# Patient Record
Sex: Male | Born: 1990
Health system: Southern US, Community
[De-identification: ages and names within clinical notes are randomized; demographics above are authoritative.]

## PROBLEM LIST (undated history)

## (undated) DIAGNOSIS — K219 Gastro-esophageal reflux disease without esophagitis: Secondary | ICD-10-CM

## (undated) DIAGNOSIS — F419 Anxiety disorder, unspecified: Secondary | ICD-10-CM

## (undated) DIAGNOSIS — F329 Major depressive disorder, single episode, unspecified: Secondary | ICD-10-CM

## (undated) DIAGNOSIS — T7840XA Allergy, unspecified, initial encounter: Secondary | ICD-10-CM

## (undated) DIAGNOSIS — I10 Essential (primary) hypertension: Secondary | ICD-10-CM

## (undated) HISTORY — DX: Allergy, unspecified, initial encounter: T78.40XA

## (undated) HISTORY — DX: Anxiety disorder, unspecified: F41.9

## (undated) HISTORY — DX: Major depressive disorder, single episode, unspecified: F32.9

## (undated) HISTORY — DX: Gastro-esophageal reflux disease without esophagitis: K21.9

---
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
## 2008-01-04 ENCOUNTER — Ambulatory Visit: Payer: Self-pay | Admitting: Vascular Surgery

## 2011-03-15 NOTE — Procedures (Signed)
RENAL ARTERY DUPLEX EVALUATION   INDICATION:  Uncontrolled hypertension.   HISTORY:  Diabetes:  No.  Cardiac:  No.  Hypertension:  Yes.  Smoking:  No.   RENAL ARTERY DUPLEX FINDINGS:  Aorta-Proximal Right:  161 cm/s  Aorta-Mid:  163 cm/s  Aorta-Distal:  147 cm/s  Celiac Artery Origin:  156 cm/s  SMA Origin:  178 cm/s                                    RIGHT               LEFT  Renal Artery Origin:             158 cm/s            144 cm/s  Renal Artery Proximal:           169 cm/s            165 cm/s  Renal Artery Mid:                93 cm/s             107 cm/s  Renal Artery Distal:             85 cm/s             100 cm/s  Hilar Acceleration Time (AT):    m/s2                m/s2  Renal-Aortic Ratio (RAR):        1                   1  Kidney Size:                     11.00 cm            11.58 cm  End Diastolic Ratio (EDR):  Resistive Index (RI):            0.64                0.68   IMPRESSION:  1. No evidence of stenosis noted in bilateral renal arteries based on      aorta renal ratio and velocity criteria.  2. Bilateral kidneys measured within normal limit, right measured      11.00 cm, left measured 11.58 cm.  3. Bilateral resistive indexes are within normal limits.   ___________________________________________  Quita Skye Hart [place], M.D.   MG/MEDQ  D:  01/04/2008  T:  01/04/2008  Job:  [id number]

## 2011-12-12 ENCOUNTER — Encounter (HOSPITAL_COMMUNITY): Payer: Self-pay | Admitting: Emergency Medicine

## 2011-12-12 ENCOUNTER — Emergency Department (HOSPITAL_COMMUNITY)
Admission: EM | Admit: 2011-12-12 | Discharge: 2011-12-12 | Disposition: A | Discharge status: Home / Self Care | Payer: BC Managed Care – PPO | Source: Home / Self Care

## 2011-12-12 DIAGNOSIS — J019 Acute sinusitis, unspecified: Secondary | ICD-10-CM

## 2011-12-12 HISTORY — DX: Essential (primary) hypertension: I10

## 2011-12-12 MED ORDER — DOXYCYCLINE HYCLATE 100 MG PO CAPS: 100.0000 mg | ORAL_CAPSULE | Freq: Two times a day (BID) | ORAL | Status: AC

## 2011-12-12 MED ORDER — PSEUDOEPHEDRINE-GUAIFENESIN ER 120-1200 MG PO TB12: 120.0000 mg | ORAL_TABLET | Freq: Two times a day (BID) | ORAL | Status: DC

## 2011-12-12 NOTE — ED Provider Notes (Signed)
History     CSN: [id number]  Arrival date & time 12/12/11  1817   None     Chief Complaint  Patient presents with  . Sore Throat    (Consider location/radiation/quality/duration/timing/severity/associated sxs/prior treatment) Patient is a 21 y.o. male presenting with pharyngitis. The history is provided by the patient.  Sore Throat This is a new problem. The current episode started more than 1 week ago (2 week hx). The problem has not changed since onset.Pertinent negatives include no chest pain, no abdominal pain and no shortness of breath. The symptoms are aggravated by coughing.    Past Medical History  Diagnosis Date  . Hypertension     History reviewed. No pertinent past surgical history.  No family history on file.  History  Substance Use Topics  . Smoking status: Not on file  . Smokeless tobacco: Not on file  . Alcohol Use: No      Review of Systems  Constitutional: Negative.   HENT: Positive for congestion, sore throat, rhinorrhea and postnasal drip.   Respiratory: Positive for cough. Negative for shortness of breath and wheezing.   Cardiovascular: Negative for chest pain.  Gastrointestinal: Negative for abdominal pain.  Skin: Negative.     Allergies  Review of patient's allergies indicates no known allergies.  Home Medications   Current Outpatient Rx  Name Route Sig Dispense Refill  . LABETALOL HCL 100 MG PO TABS Oral Take 100 mg by mouth 2 (two) times daily.    Marland Kitchen LISINOPRIL 10 MG PO TABS Oral Take 10 mg by mouth daily.    Marland Kitchen DOXYCYCLINE HYCLATE 100 MG PO CAPS Oral Take 1 capsule (100 mg total) by mouth 2 (two) times daily. 20 capsule 0  . PSEUDOEPHEDRINE-GUAIFENESIN ER [phone] MG PO TB12 Oral Take 120-1,200 mg by mouth 2 (two) times daily. 30 each 0    There were no vitals taken for this visit.  Physical Exam  Nursing note and vitals reviewed. Constitutional: He appears well-developed and well-nourished.  HENT:  Head: Normocephalic.  Right  Ear: External ear normal.  Left Ear: External ear normal.  Nose: Nose normal.  Mouth/Throat: Oropharynx is clear and moist.  Eyes: Conjunctivae are normal. Pupils are equal, round, and reactive to light.  Neck: Normal range of motion. Neck supple.  Cardiovascular: Normal rate, normal heart sounds and intact distal pulses.   Pulmonary/Chest: Effort normal and breath sounds normal.  Lymphadenopathy:    He has no cervical adenopathy.  Skin: Skin is warm and dry.    ED Course  Procedures (including critical care time)  Labs Reviewed - No data to display No results found.   1. Sinusitis acute       MDM          [name], MD 12/16/11 1312

## 2011-12-12 NOTE — ED Notes (Signed)
Pt. Stated, sore throat, chest congestion, cough, and nose stopped up for about 2 weeks.

## 2016-08-02 DIAGNOSIS — Z23 Encounter for immunization: Secondary | ICD-10-CM | POA: Diagnosis not present

## 2016-10-14 DIAGNOSIS — L74519 Primary focal hyperhidrosis, unspecified: Secondary | ICD-10-CM | POA: Diagnosis not present

## 2016-10-14 DIAGNOSIS — G250 Essential tremor: Secondary | ICD-10-CM | POA: Diagnosis not present

## 2016-10-14 DIAGNOSIS — I10 Essential (primary) hypertension: Secondary | ICD-10-CM | POA: Diagnosis not present

## 2016-11-25 DIAGNOSIS — J029 Acute pharyngitis, unspecified: Secondary | ICD-10-CM | POA: Diagnosis not present

## 2016-11-25 DIAGNOSIS — J309 Allergic rhinitis, unspecified: Secondary | ICD-10-CM | POA: Diagnosis not present

## 2017-05-12 ENCOUNTER — Emergency Department (HOSPITAL_BASED_OUTPATIENT_CLINIC_OR_DEPARTMENT_OTHER)
Admission: EM | Admit: 2017-05-12 | Discharge: 2017-05-12 | Disposition: A | Discharge status: Home / Self Care | Payer: BLUE CROSS/BLUE SHIELD | Attending: Emergency Medicine | Admitting: Emergency Medicine

## 2017-05-12 ENCOUNTER — Encounter (HOSPITAL_BASED_OUTPATIENT_CLINIC_OR_DEPARTMENT_OTHER): Payer: Self-pay | Admitting: Emergency Medicine

## 2017-05-12 DIAGNOSIS — I10 Essential (primary) hypertension: Secondary | ICD-10-CM | POA: Insufficient documentation

## 2017-05-12 DIAGNOSIS — Z79899 Other long term (current) drug therapy: Secondary | ICD-10-CM | POA: Diagnosis not present

## 2017-05-12 DIAGNOSIS — R6884 Jaw pain: Secondary | ICD-10-CM | POA: Diagnosis not present

## 2017-05-12 MED ORDER — HYDROCODONE-ACETAMINOPHEN 5-325 MG PO TABS
1.0000 | ORAL_TABLET | Freq: Once | ORAL | Status: AC
  Administered 2017-05-12: 1 via ORAL
  Filled 2017-05-12: qty 1

## 2017-05-12 NOTE — ED Triage Notes (Signed)
Patient states that he developed right sided jaw pain acutely about 30 minutes ago

## 2017-05-12 NOTE — Discharge Instructions (Signed)
Alternate 600 mg of ibuprofen and [phone] mg of Tylenol every 3 hours for the next 3 or 4 days. Do not exceed 4000 mg of Tylenol daily. Apply ice or heat to the affected area for comfort. Follow-up with your primary care physician or dentist for reevaluation. Return to the ED immediately if any concerning signs or symptoms develop such as fever, chills, facial swelling, difficulty swallowing, drooling, or rash.

## 2017-05-12 NOTE — ED Notes (Signed)
Pt. Reports starting to eat lunch today and feeling like someone had hit him in the R jaw.  Pt. Has no noted injury and no look of dislocation to the R jaw.  Pt. Reports he feels like he cant open his mouth on the R side with out pain in the R jaw at the R ear.  Pt. Explains that he has pain into his lower jaw area.Marland Kitchen

## 2017-05-12 NOTE — ED Provider Notes (Signed)
[facility] Provider Note   CSN: [id number] Arrival date & time: 05/12/17  1402     History   Chief Complaint Chief Complaint  Patient presents with  . Jaw Pain    HPI Joseph Mccullough is a 26 y.o. male who presents today with chief complaint acute onset, constant right-sided jaw pain which began 30 minutes ago when he was sitting down to eat. Pain is sharp and stabbing in nature. Worse with swallowing. Patient able to eat and drink without difficulty. He has taken ibuprofen without relief of his symptoms. Patient denies trauma or injury. Denies fevers, chills, facial swelling, sore throat, nasal congestion, ear pain, shortness of breath, chest pain, drooling.  The history is provided by the patient.    Past Medical History:  Diagnosis Date  . Hypertension     There are no active problems to display for this patient.   History reviewed. No pertinent surgical history.     Home Medications    Prior to Admission medications   Medication Sig Start Date End Date Taking? Authorizing Provider  propantheline (PROBANTHINE) 15 MG tablet Take 15 mg by mouth 3 (three) times daily with meals.   Yes [provider]  propranolol (INDERAL) 10 MG tablet Take 10 mg by mouth 3 (three) times daily.   Yes [provider]  labetalol (NORMODYNE) 100 MG tablet Take 100 mg by mouth 2 (two) times daily.    [provider]  lisinopril (PRINIVIL,ZESTRIL) 10 MG tablet Take 10 mg by mouth daily.    [provider]  Pseudoephedrine-Guaifenesin [phone] MG TB12 Take 120-1,200 mg by mouth 2 (two) times daily. 12/12/11   Billy Fischer, MD    Family History History reviewed. No pertinent family history.  Social History Social History  Substance Use Topics  . Smoking status: Never Smoker  . Smokeless tobacco: Never Used  . Alcohol use No     Allergies   Patient has no known allergies.   Review of Systems Review of Systems  Constitutional:  Negative for chills and fever.  HENT: Negative for dental problem, drooling, ear discharge, ear pain, facial swelling, sore throat and trouble swallowing.        Right jaw pain  Respiratory: Negative for shortness of breath.   Cardiovascular: Negative for chest pain.     Physical Exam Updated Vital Signs BP (!) 141/86 (BP Location: Left Arm)   Pulse (!) 54   Temp 97.8 F (36.6 C) (Oral)   Resp 18   Ht 6\' 2"  (1.88 m)   Wt 81.6 kg (180 lb)   SpO2 100%   BMI 23.11 kg/m   Physical Exam  Constitutional: He appears well-developed and well-nourished. No distress.  HENT:  Head: Normocephalic and atraumatic.  Right Ear: External ear normal.  Left Ear: External ear normal.  Mouth/Throat: Oropharynx is clear and moist.  TMs normal bilaterally, no frontal or maxillary sinus TTP. nasal septum is midline with pink mucosa. Posterior oropharynx is without tonsillar hypertrophy, erythema, exudates, or uvular deviation. No trismus or sublingual abnormalities. Dentition is normal in gingiva appear pink and healthy throughout. No tenderness elicited on palpation of the maxilla or mandible. No swelling of the face. No deformity or crepitus noted.  Eyes: Pupils are equal, round, and reactive to light. Conjunctivae are normal. Right eye exhibits no discharge. Left eye exhibits no discharge.  Neck: Normal range of motion. Neck supple. No JVD present. No tracheal deviation present. No thyromegaly present.  Cardiovascular: Normal rate.  Pulmonary/Chest: Effort normal.  Abdominal: He exhibits no distension.  Musculoskeletal: Normal range of motion. He exhibits no edema or tenderness.  Lymphadenopathy:    He has no cervical adenopathy.  Neurological: He is alert. No cranial nerve deficit or sensory deficit.  Fluent speech, no facial droop, cranial nerves III through XII tested and intact. No pronator drift. Sensation intact to soft touch of extremities.  Skin: Skin is dry. Capillary refill takes less  than 2 seconds. No erythema.  Psychiatric: He has a normal mood and affect. His behavior is normal.  Nursing note and vitals reviewed.    ED Treatments / Results  Labs (all labs ordered are listed, but only abnormal results are displayed) Labs Reviewed - No data to display  EKG  EKG Interpretation None       Radiology No results found.  Procedures Procedures (including critical care time)  Medications Ordered in ED Medications  HYDROcodone-acetaminophen (NORCO/VICODIN) 5-325 MG per tablet 1 tablet (1 tablet Oral Given 05/12/17 1447)     Initial Impression / Assessment and Plan / ED Course  I have reviewed the triage vital signs and the nursing notes.  Pertinent labs & imaging results that were available during my care of the patient were reviewed by me and considered in my medical decision making (see chart for details).     Patient with acute onset of right-sided jaw pain 30 minutes PTA. Afebrile, vital signs are stable, in no apparent distress. Dentition is normal, no suspicion of dental abscess, PTA, or soft tissue infection. Low suspicion of trigeminal neuralgia. Pain is not reproducible on palpation. Airway is patent, and patient is able to tolerate by mouth fluids. Suspect possible TMJ versus VZV (however no rash). Low suspicion of CVA No further emergent workup required at this time. He will follow-up with his dentist and primary care physician for reevaluation. Pain managed while in the ED. Discussed indications for return to the ED. Pt verbalized understanding of and agreement with plan and is safe for discharge home at this time.   Final Clinical Impressions(s) / ED Diagnoses   Final diagnoses:  Jaw pain    New Prescriptions Discharge Medication List as of 05/12/2017  2:36 PM       Renita Papa, PA-C 05/12/17 1548    Fredia Sorrow, MD 05/13/17 1108

## 2017-06-30 DIAGNOSIS — J329 Chronic sinusitis, unspecified: Secondary | ICD-10-CM | POA: Diagnosis not present

## 2017-10-13 DIAGNOSIS — I10 Essential (primary) hypertension: Secondary | ICD-10-CM | POA: Diagnosis not present

## 2017-10-13 DIAGNOSIS — Z23 Encounter for immunization: Secondary | ICD-10-CM | POA: Diagnosis not present

## 2017-10-13 DIAGNOSIS — G250 Essential tremor: Secondary | ICD-10-CM | POA: Diagnosis not present

## 2017-10-13 DIAGNOSIS — L74519 Primary focal hyperhidrosis, unspecified: Secondary | ICD-10-CM | POA: Diagnosis not present

## 2018-06-04 DIAGNOSIS — R109 Unspecified abdominal pain: Secondary | ICD-10-CM | POA: Diagnosis not present

## 2018-07-04 ENCOUNTER — Ambulatory Visit (INDEPENDENT_AMBULATORY_CARE_PROVIDER_SITE_OTHER): Payer: BLUE CROSS/BLUE SHIELD | Admitting: Medical

## 2018-07-04 ENCOUNTER — Encounter: Payer: Self-pay | Admitting: Medical

## 2018-07-04 VITALS — BP 125/70 | HR 67 | Temp 98.0°F | Resp 16 | Ht 74.0 in | Wt 188.6 lb

## 2018-07-04 DIAGNOSIS — J309 Allergic rhinitis, unspecified: Secondary | ICD-10-CM

## 2018-07-04 DIAGNOSIS — I10 Essential (primary) hypertension: Secondary | ICD-10-CM

## 2018-07-04 DIAGNOSIS — H6123 Impacted cerumen, bilateral: Secondary | ICD-10-CM | POA: Diagnosis not present

## 2018-07-04 DIAGNOSIS — R140 Abdominal distension (gaseous): Secondary | ICD-10-CM | POA: Diagnosis not present

## 2018-07-04 DIAGNOSIS — R1013 Epigastric pain: Secondary | ICD-10-CM | POA: Diagnosis not present

## 2018-07-04 DIAGNOSIS — R110 Nausea: Secondary | ICD-10-CM | POA: Diagnosis not present

## 2018-07-04 DIAGNOSIS — R6881 Early satiety: Secondary | ICD-10-CM | POA: Diagnosis not present

## 2018-07-04 MED ORDER — LEVOCETIRIZINE DIHYDROCHLORIDE 5 MG PO TABS: 5.0000 mg | ORAL_TABLET | Freq: Every evening | ORAL | 11 refills | Status: DC

## 2018-07-04 MED ORDER — FLUTICASONE PROPIONATE 50 MCG/ACT NA SUSP: 2.0000 | Freq: Every day | NASAL | 11 refills | Status: DC

## 2018-07-04 MED FILL — FLUTICASONE PROP 50 MCG SPR: 50 | 30 days supply | Qty: 16 | Fill #0

## 2018-07-04 MED FILL — LEVOCETIRIZINE 5 MG TABLET: 5 | 30 days supply | Qty: 30 | Fill #0

## 2018-07-04 NOTE — Patient Instructions (Addendum)
You had wax removed completely. If you get wax re-build up again in future use debrox for 3-4 days and then come in for check/lavage.  For allergic rhinitis and eustachian tube pressure rx flonase and xyzal.  Bp well controlled. Continue current bp medicine.   Follow up as needed. If you want or need CPE then schedule early  8 am and come in fasting.  Would ask that you sign release form so we can get lab done at [facility] and have them send over GI work up as well.

## 2018-07-04 NOTE — Progress Notes (Signed)
Subjective:    Patient ID: Joseph Mccullough, male    DOB: Mar 26, 1991, 27 y.o.   MRN: [id number]  HPI  Pt in for first time.  Works Museum/gallery curator. Does exercise 2-3 times a week.   Pt in for some recent ear congestion. Pt states prior provider recommended using otc debrox. Pt states first time felt was present about 5 months ago. He tried to use debrox and he states felt worse after attempting lavage at home. Has not tried to remove wax since. He states feels blocked sensation in both ears. Rt side feels worse.  Also some chronic nasal congestion and some pnd.. No sneezing.  This has been going on for several years.   When swallows at times will feel crackle noise in his ear.     Review of Systems  Constitutional: Negative for chills, fatigue and fever.  HENT: Positive for congestion and postnasal drip. Negative for facial swelling, nosebleeds, sinus pressure and sinus pain.        Ears feel blocked.see hpi.  Respiratory: Negative for cough, chest tightness, shortness of breath and wheezing.   Cardiovascular: Negative for chest pain and palpitations.  Gastrointestinal: Negative for abdominal pain.  Musculoskeletal: Negative for back pain, myalgias and neck pain.  Skin: Negative for rash.  Neurological: Negative for dizziness, seizures, speech difficulty, weakness and light-headedness.  Hematological: Negative for adenopathy. Does not bruise/bleed easily.  Psychiatric/Behavioral: Negative for behavioral problems, confusion, sleep disturbance and suicidal ideas. The patient is not nervous/anxious.     Past Medical History:  Diagnosis Date  . Allergy   . Hypertension      Social History   Socioeconomic History  . Marital status: Single    Spouse name: Not on file  . Number of children: Not on file  . Years of education: Not on file  . Highest education level: Not on file  Occupational History  . Not on file  Social Needs  . Financial resource strain: Not on file  . Food  insecurity:    Worry: Not on file    Inability: Not on file  . Transportation needs:    Medical: Not on file    Non-medical: Not on file  Tobacco Use  . Smoking status: Never Smoker  . Smokeless tobacco: Never Used  Substance and Sexual Activity  . Alcohol use: No  . Drug use: No  . Sexual activity: Not on file  Lifestyle  . Physical activity:    Days per week: Not on file    Minutes per session: Not on file  . Stress: Not on file  Relationships  . Social connections:    Talks on phone: Not on file    Gets together: Not on file    Attends religious service: Not on file    Active member of club or organization: Not on file    Attends meetings of clubs or organizations: Not on file    Relationship status: Not on file  . Intimate partner violence:    Fear of current or ex partner: Not on file    Emotionally abused: Not on file    Physically abused: Not on file    Forced sexual activity: Not on file  Other Topics Concern  . Not on file  Social History Narrative  . Not on file    No past surgical history on file.  Family History  Problem Relation Age of Onset  . Hypertension Father     No Known Allergies  Current Outpatient Medications on File Prior to Visit  Medication Sig Dispense Refill  . lisinopril (PRINIVIL,ZESTRIL) 10 MG tablet Take 10 mg by mouth daily.    . propranolol (INDERAL) 20 MG tablet   1  . propantheline (PROBANTHINE) 15 MG tablet Take 15 mg by mouth 3 (three) times daily with meals.     No current facility-administered medications on file prior to visit.     BP 110/60   Pulse 67   Temp 98 F (36.7 C) (Oral)   Resp 16   Ht 6\' 2"  (1.88 m)   Wt 188 lb 9.6 oz (85.5 kg)   SpO2 98%   BMI 24.21 kg/m       Objective:   Physical Exam  General  Mental Status - Alert. General Appearance - Well groomed. Not in acute distress.  Skin Rashes- No Rashes.  HEENT Head- Normal. Ear Auditory Canal - Left- severe wax blockage but cleared  completely after lavae. Right - severe wax blockage but cleared completely after lavageTympanic Membrane- Left- Normal. After wax removal Right- after wax removal Eye Sclera/Conjunctiva- Left- Normal. Right- Normal. Nose & Sinuses Nasal Mucosa- Left-  Boggy and Congested. Right-  Boggy and  Congested.Bilateral  No maxillary and no  frontal sinus pressure. Mouth & Throat Lips: Upper Lip- Normal: no dryness, cracking, pallor, cyanosis, or vesicular eruption. Lower Lip-Normal: no dryness, cracking, pallor, cyanosis or vesicular eruption. Buccal Mucosa- Bilateral- No Aphthous ulcers. Oropharynx- No Discharge or Erythema. +pnd Tonsils: Characteristics- Bilateral- No Erythema or Congestion. Size/Enlargement- Bilateral- No enlargement. Discharge- bilateral-None.  Neck Neck- Supple. No Masses.   Chest and Lung Exam Auscultation: Breath Sounds:-Clear even and unlabored.  Cardiovascular Auscultation:Rythm- Regular, rate and rhythm. Murmurs & Other Heart Sounds:Ausculatation of the heart reveal- No Murmurs.  Lymphatic Head & Neck General Head & Neck Lymphatics: Bilateral: Description- No Localized lymphadenopathy.       Assessment & Plan:  You had wax removed completely. If you get  Wax re-build up again in future use debrox for 3-4 days and then come in for check/lavage.  For allergic rhinitis and eustachian tube pressure rx flonase and xyzal.  Bp well controlled. Continue current bp medicine.  Follow up as needed. If you want or need CPE then schedule early  8 am and come in fasting.  Would ask that you sign release form so we can get lab done at [facility] and have them send over GI work up as well.  Mackie Pai, PA-C

## 2018-07-09 ENCOUNTER — Encounter: Payer: Self-pay | Admitting: Medical

## 2018-07-10 ENCOUNTER — Telehealth: Payer: Self-pay | Admitting: Medical

## 2018-07-10 DIAGNOSIS — H938X3 Other specified disorders of ear, bilateral: Secondary | ICD-10-CM

## 2018-07-10 NOTE — Telephone Encounter (Signed)
Referral to ent placed.

## 2018-07-20 DIAGNOSIS — Z23 Encounter for immunization: Secondary | ICD-10-CM | POA: Diagnosis not present

## 2018-08-02 DIAGNOSIS — K2930 Chronic superficial gastritis without bleeding: Secondary | ICD-10-CM | POA: Diagnosis not present

## 2018-08-02 DIAGNOSIS — K449 Diaphragmatic hernia without obstruction or gangrene: Secondary | ICD-10-CM | POA: Diagnosis not present

## 2018-08-02 DIAGNOSIS — R1013 Epigastric pain: Secondary | ICD-10-CM | POA: Diagnosis not present

## 2018-08-02 DIAGNOSIS — K2980 Duodenitis without bleeding: Secondary | ICD-10-CM | POA: Diagnosis not present

## 2018-08-02 DIAGNOSIS — K269 Duodenal ulcer, unspecified as acute or chronic, without hemorrhage or perforation: Secondary | ICD-10-CM | POA: Diagnosis not present

## 2018-08-03 DIAGNOSIS — M26623 Arthralgia of bilateral temporomandibular joint: Secondary | ICD-10-CM | POA: Diagnosis not present

## 2018-08-03 DIAGNOSIS — H6983 Other specified disorders of Eustachian tube, bilateral: Secondary | ICD-10-CM | POA: Diagnosis not present

## 2018-08-03 DIAGNOSIS — J392 Other diseases of pharynx: Secondary | ICD-10-CM | POA: Diagnosis not present

## 2018-08-03 DIAGNOSIS — J342 Deviated nasal septum: Secondary | ICD-10-CM | POA: Diagnosis not present

## 2018-08-03 MED FILL — OMEPRAZOLE 40 MG CPDR: 40 | 30 days supply | Qty: 30 | Fill #0

## 2018-08-13 DIAGNOSIS — K259 Gastric ulcer, unspecified as acute or chronic, without hemorrhage or perforation: Secondary | ICD-10-CM | POA: Diagnosis not present

## 2018-08-13 DIAGNOSIS — Z0110 Encounter for examination of ears and hearing without abnormal findings: Secondary | ICD-10-CM | POA: Diagnosis not present

## 2018-08-13 DIAGNOSIS — J3489 Other specified disorders of nose and nasal sinuses: Secondary | ICD-10-CM | POA: Diagnosis not present

## 2018-08-13 DIAGNOSIS — K2930 Chronic superficial gastritis without bleeding: Secondary | ICD-10-CM | POA: Diagnosis not present

## 2018-08-13 DIAGNOSIS — K2980 Duodenitis without bleeding: Secondary | ICD-10-CM | POA: Diagnosis not present

## 2018-08-13 DIAGNOSIS — H938X3 Other specified disorders of ear, bilateral: Secondary | ICD-10-CM | POA: Diagnosis not present

## 2018-08-13 DIAGNOSIS — H6983 Other specified disorders of Eustachian tube, bilateral: Secondary | ICD-10-CM | POA: Diagnosis not present

## 2018-08-15 ENCOUNTER — Other Ambulatory Visit: Payer: Self-pay | Admitting: Otolaryngology

## 2018-08-15 ENCOUNTER — Telehealth: Payer: Self-pay

## 2018-08-15 DIAGNOSIS — D490 Neoplasm of unspecified behavior of digestive system: Secondary | ICD-10-CM

## 2018-08-15 NOTE — Telephone Encounter (Signed)
Sure I am willing to take him on

## 2018-08-15 NOTE — Telephone Encounter (Signed)
Copied from [place] [id number]. Topic: Quick Communication - See Telephone Encounter >> Aug 15, 2018 10:14 AM Hewitt Shorts wrote: Pt father spencer Zeringue is calling to see if Dr. Charlett Blake will be willing to start seeing patient (father is a patient of Dr. Charlett Blake) pt sees edwards saguier currently   Best number for spencer

## 2018-08-15 NOTE — Telephone Encounter (Signed)
Please advise 

## 2018-08-15 NOTE — Telephone Encounter (Signed)
I have seen him only one time. If Dr. Charlett Blake will accept him I am ok with him switching.

## 2018-08-16 NOTE — Telephone Encounter (Signed)
Called pt to offer appt tomorrow. Patient states that it is too short of notice with work schedule. Advised patient to call back 1st thing in the morning if he can come. At 10:45am.

## 2018-08-16 NOTE — Telephone Encounter (Signed)
I can work him in on the morning of 08/17/18 please arrange

## 2018-08-16 NOTE — Telephone Encounter (Signed)
Pt dad Joseph Mccullough is calling his son has tmj and trouble swallowing and would like to see dr blyth asap. Pt has seen ent . Pt is having mental issue relating to TMJ

## 2018-08-17 ENCOUNTER — Encounter: Payer: Self-pay | Admitting: Family Medicine

## 2018-08-17 ENCOUNTER — Ambulatory Visit: Payer: BLUE CROSS/BLUE SHIELD | Admitting: Family Medicine

## 2018-08-17 VITALS — BP 121/69 | HR 65 | Temp 98.5°F | Resp 18 | Ht 74.0 in | Wt 182.0 lb

## 2018-08-17 DIAGNOSIS — R220 Localized swelling, mass and lump, head: Secondary | ICD-10-CM | POA: Diagnosis not present

## 2018-08-17 DIAGNOSIS — J359 Chronic disease of tonsils and adenoids, unspecified: Secondary | ICD-10-CM

## 2018-08-17 DIAGNOSIS — F32A Depression, unspecified: Secondary | ICD-10-CM

## 2018-08-17 DIAGNOSIS — K449 Diaphragmatic hernia without obstruction or gangrene: Secondary | ICD-10-CM

## 2018-08-17 DIAGNOSIS — T7840XS Allergy, unspecified, sequela: Secondary | ICD-10-CM | POA: Diagnosis not present

## 2018-08-17 DIAGNOSIS — H9313 Tinnitus, bilateral: Secondary | ICD-10-CM | POA: Diagnosis not present

## 2018-08-17 DIAGNOSIS — K219 Gastro-esophageal reflux disease without esophagitis: Secondary | ICD-10-CM

## 2018-08-17 DIAGNOSIS — F329 Major depressive disorder, single episode, unspecified: Secondary | ICD-10-CM

## 2018-08-17 DIAGNOSIS — F419 Anxiety disorder, unspecified: Secondary | ICD-10-CM

## 2018-08-17 DIAGNOSIS — I10 Essential (primary) hypertension: Secondary | ICD-10-CM

## 2018-08-17 MED ORDER — ALPRAZOLAM 0.25 MG PO TABS: 0.2500 mg | ORAL_TABLET | Freq: Two times a day (BID) | ORAL | 1 refills | Status: DC | PRN

## 2018-08-17 MED ORDER — PROPRANOLOL HCL 20 MG PO TABS: 20.0000 mg | ORAL_TABLET | Freq: Every day | ORAL | 1 refills | Status: DC

## 2018-08-17 MED ORDER — FAMOTIDINE 20 MG PO TABS: 20.0000 mg | ORAL_TABLET | Freq: Every day | ORAL | 5 refills | Status: DC

## 2018-08-17 MED ORDER — LISINOPRIL 10 MG PO TABS: 10.0000 mg | ORAL_TABLET | Freq: Every day | ORAL | 1 refills | Status: DC

## 2018-08-17 MED ORDER — SERTRALINE HCL 50 MG PO TABS: 50.0000 mg | ORAL_TABLET | Freq: Every day | ORAL | 3 refills | Status: DC

## 2018-08-17 MED FILL — FAMOTIDINE 20 MG TABLET: 20 | 30 days supply | Qty: 30 | Fill #0

## 2018-08-17 MED FILL — ALPRAZolam 0.25 MG TABS: 0.25 | 15 days supply | Qty: 30 | Fill #0 | Status: TO

## 2018-08-17 MED FILL — SERTRALINE HCL 50 MG TABLET: 50 | 30 days supply | Qty: 30 | Fill #0 | Status: TO

## 2018-08-17 NOTE — Telephone Encounter (Signed)
Pt has been sch for today 08/17/18 at 1045 am

## 2018-08-17 NOTE — Patient Instructions (Addendum)
[email or web address] multistrain probiotics   Add Xyzal at night Hydrate well  Small, frequent meals not too close to bed and do not mix fatty and spicy together  Nasal saline twice daily Living With Obsessive-Compulsive Disorder If you have been diagnosed with obsessive-compulsive disorder (OCD), you may be relieved that you now know why you have felt or behaved a certain way. You may also feel overwhelmed about the treatment ahead, how to get the support you need, and how to deal with the condition day-to-day. With treatment and support, you can manage your OCD. How to manage lifestyle changes Managing stress Stress is your body's reaction to life changes and events, both good and bad. Stress can play a major role in OCD, so it is important to learn how to cope with stress. Some techniques to cope with stress include:  Meditation, muscle relaxation, and breathing exercises.  Exercise. Even a short daily walk can help to lower stress levels.  Getting enough good-quality sleep.  Spending time on hobbies that you enjoy.  Accepting and letting go of things that you cannot change.  To deal with stress associated with OCD, your health care provider may recommend exposure and response prevention therapy. In this therapy, you will be exposed to the distressing situation that triggers your compulsion and be prevented from responding to it. With repetition of this process over time, you will no longer feel the distress or need to perform the compulsion. Medicines Your health care provider may suggest certain antidepressant medicines if he or she feels that they will help to improve your condition. Avoid using alcohol and other substances that may prevent your medicines from working properly (may interact). It is also important to:  Talk with your pharmacist or health care provider about all medicines that you take, their possible side effects, and which medicines are safe to take  together.  Make it your goal to take part in all treatment decisions (shared decision-making). Ask about possible side effects of medicines that your health care provider recommends, and tell him or her how you feel about having those side effects. It is best if shared decision-making with your health care provider is part of your total treatment plan.  If you are taking medicines as part of your treatment, do not stop taking medicines before you ask your health care provider if it is safe to stop. You may need to have the medicine slowly decreased (tapered) over time to lower the risk of harmful side effects. Relationships Your family and friends may need to learn about your OCD in order to cope with your condition and support you. Consider giving education materials to friends and family. Family therapy may also help to lower stress and relieve tension. How to recognize changes in your condition Some signs that your condition may be getting worse include:  Being anxious about germs or dirt.  Having harmful thoughts about yourself or others.  Making sure that household objects are alike or perfectly organized in a specific way.  Having great difficulty making decisions, or second-guessing yourself after making a decision.  Constant cleaning and handwashing.  Repeating behavior such as repeatedly checking to see if a door is locked or the oven is off.  Counting nonstop or uncontrollably.  Where to find support Talking with others It may be difficult to tell loved ones about your condition, but they can be a good support system for you. You can work with your therapist to decide whom to tell  and when to tell them. Here are some tips for starting the conversation:  Start by sharing your experience with OCD. It is up to you how much detail you want to provide.  Let your loved ones know that you are seeking treatment.  Do not expect loved ones to understand your condition right  away.  Finances Not all insurance plans cover mental health care, so it is important to check with your insurance carrier. If paying for co-pays or counseling services is a problem, search for a local or county mental health care center. Public mental health care services may be offered there at a low cost or no cost when you are not able to see a private health care provider. If you are taking medicine for depression, you may be able to get the generic form, which may be less expensive than brand-name medicine. Some makers of prescription medicines also offer help to patients who cannot afford the medicines they need. Follow these instructions at home:  Check with your health care provider before starting any new prescription or over-the-counter medicines.  Ask for support from trusted family members or friends to make sure you stay on-track with your treatment.  Keep all follow-up visits as told by your health care provider and therapist. This is important.  Keep a journal to write down your daily moods, medicines, sleep habits, and life events. Doing this may help you have more success with your treatment.  Maintain a healthy lifestyle. Eat a healthy diet, exercise regularly, get plenty of sleep, and take time to relax. Questions to ask your health care provider  If you are taking medicines: ? How long do I need to take medicine? ? Are there any long-term side effects of my medicine? ? Are there any alternatives to taking medicine?  How would I benefit from therapy?  How often should I follow up with a health care provider? Where to find more information:  International OCD Foundation: [employer].iocdf.[employer] on Mental Illness (NAMI): Se[employer]dk Contact a health care provider if:  Your symptoms get worse or they do not get better with treatment.  You develop new symptoms. Get help right away  if:  You have severe side effects after taking your medicine.  You have thoughts about hurting yourself or others. If you ever feel like you may hurt yourself or others, or have thoughts about taking your own life, get help right away. You can go to your nearest emergency department or call:  Your local emergency services (911 in the [place]  A suicide crisis helpline, such as the [place] at (864)117-3460. This is open 24 hours a day.  Summary  Stress can play a major role in obsessive-compulsive disorder (OCD). Learning ways to deal with stress may help your treatment work better for you.  If you are taking medicines as part of your treatment, do not stop taking medicines before you ask your health care provider if it is safe to stop.  When talking with family members and friends about your OCD, decide how much detail you want to give them and be patient as they work to understand your condition.  Keep all follow-up visits as told by your health care provider and therapist. This is important. This information is not intended to replace advice given to you by your health care provider. Make sure you discuss any questions you have with your health care provider. Document Released: 02/16/2017 Document Revised: 02/16/2017 Document Reviewed: 02/16/2017 Elsevier  Interactive Patient Education  [employer].

## 2018-08-19 ENCOUNTER — Encounter: Payer: Self-pay | Admitting: Family Medicine

## 2018-08-19 DIAGNOSIS — F419 Anxiety disorder, unspecified: Secondary | ICD-10-CM

## 2018-08-19 DIAGNOSIS — T7840XA Allergy, unspecified, initial encounter: Secondary | ICD-10-CM | POA: Insufficient documentation

## 2018-08-19 DIAGNOSIS — F329 Major depressive disorder, single episode, unspecified: Secondary | ICD-10-CM | POA: Insufficient documentation

## 2018-08-19 DIAGNOSIS — F32A Depression, unspecified: Secondary | ICD-10-CM | POA: Insufficient documentation

## 2018-08-19 HISTORY — DX: Anxiety disorder, unspecified: F41.9

## 2018-08-19 HISTORY — DX: Depression, unspecified: F32.A

## 2018-08-19 NOTE — Progress Notes (Signed)
Subjective:    Patient ID: Joseph Mccullough, male    DOB: 15-Nov-1990, 27 y.o.   MRN: [id number]  No chief complaint on file.   HPI Patient is in today for an establish care visit with numerous concerns.  He has been not completely well since May.  He started with GI symptoms and was having epigastric pain and nausea mild changes in his bowel habits and underwent scoping with Dr. May got.  He was noted to have hiatal hernia and ulcers.  He is been on Prilosec twice daily and that has helped significantly.  Unfortunately he is also had congestion and other concerns.  Has been seen by [facility] and told he has a lesion on 1 of his adenoids and they are pursuing further imaging.  He has congestion and is using some Allegra with some results.  He also notes significant anxiety and obsessive-compulsive symptoms around his swallowing.  Says he has had this in the past.  Notes a history of TMJ with some jaw pain and tinnitus at x2.  He says the sounds of popping when he swallows is loud.  No recent febrile illness.  He acknowledges some anxiety and anhedonia secondary to his concerns about his current state of health. Denies CP/palp/SOB/congestion/fevers or GU c/o. Taking meds as prescribed Past Medical History:  Diagnosis Date  . Acid reflux 08/20/2018  . Allergy   . Anxiety and depression 08/19/2018  . Hypertension     No past surgical history on file.  Family History  Problem Relation Age of Onset  . Hypertension Father     Social History   Socioeconomic History  . Marital status: Single    Spouse name: Not on file  . Number of children: Not on file  . Years of education: Not on file  . Highest education level: Not on file  Occupational History  . Not on file  Social Needs  . Financial resource strain: Not on file  . Food insecurity:    Worry: Not on file    Inability: Not on file  . Transportation needs:    Medical: Not on file    Non-medical: Not on file  Tobacco Use  .  Smoking status: Never Smoker  . Smokeless tobacco: Never Used  Substance and Sexual Activity  . Alcohol use: No  . Drug use: No  . Sexual activity: Not on file  Lifestyle  . Physical activity:    Days per week: Not on file    Minutes per session: Not on file  . Stress: Not on file  Relationships  . Social connections:    Talks on phone: Not on file    Gets together: Not on file    Attends religious service: Not on file    Active member of club or organization: Not on file    Attends meetings of clubs or organizations: Not on file    Relationship status: Not on file  . Intimate partner violence:    Fear of current or ex partner: Not on file    Emotionally abused: Not on file    Physically abused: Not on file    Forced sexual activity: Not on file  Other Topics Concern  . Not on file  Social History Narrative  . Not on file    Outpatient Medications Prior to Visit  Medication Sig Dispense Refill  . Fexofenadine HCl (ALLEGRA ALLERGY PO) Take by mouth.    . fluticasone (FLONASE) 50 MCG/ACT nasal spray Place  2 sprays into both nostrils daily. 16 g 11  . propantheline (PROBANTHINE) 15 MG tablet Take 15 mg by mouth 3 (three) times daily with meals.    Marland Kitchen lisinopril (PRINIVIL,ZESTRIL) 10 MG tablet Take 10 mg by mouth daily.    . propranolol (INDERAL) 20 MG tablet   1  . levocetirizine (XYZAL) 5 MG tablet Take 1 tablet (5 mg total) by mouth every evening. (Patient not taking: Reported on 08/17/2018) 30 tablet 11   No facility-administered medications prior to visit.     No Known Allergies  Review of Systems  Constitutional: Positive for malaise/fatigue. Negative for chills and fever.  HENT: Positive for ear pain, sinus pain and tinnitus. Negative for congestion and hearing loss.   Eyes: Negative for discharge and redness.  Respiratory: Negative for cough, sputum production and shortness of breath.   Cardiovascular: Negative for chest pain, palpitations and leg swelling.    Gastrointestinal: Positive for heartburn and nausea. Negative for abdominal pain, blood in stool, constipation, diarrhea and vomiting.  Genitourinary: Negative for dysuria, frequency, hematuria and urgency.  Musculoskeletal: Negative for back pain, falls and myalgias.  Skin: Negative for rash.  Neurological: Positive for headaches. Negative for dizziness, sensory change, loss of consciousness and weakness.  Endo/Heme/Allergies: Negative for environmental allergies. Does not bruise/bleed easily.  Psychiatric/Behavioral: Positive for depression. Negative for suicidal ideas. The patient is nervous/anxious and has insomnia.        Objective:    Physical Exam  Constitutional: He is oriented to person, place, and time. He appears well-developed and well-nourished. No distress.  HENT:  Head: Normocephalic and atraumatic.  Right Ear: External ear normal.  Left Ear: External ear normal.  Nose: Nose normal.  Left cheek bone much more prominent than right and tender  Eyes: Pupils are equal, round, and reactive to light. Conjunctivae and EOM are normal. Right eye exhibits no discharge. Left eye exhibits no discharge.  Neck: Normal range of motion. Neck supple.  Cardiovascular: Normal rate and regular rhythm.  No murmur heard. Pulmonary/Chest: Effort normal and breath sounds normal.  Abdominal: Soft. Bowel sounds are normal. There is no tenderness.  Musculoskeletal: He exhibits no edema.  Neurological: He is alert and oriented to person, place, and time.  Skin: Skin is warm and dry.  Psychiatric: He has a normal mood and affect.  Nursing note and vitals reviewed.   BP 121/69 (BP Location: Left Arm, Patient Position: Sitting, Cuff Size: Normal)   Pulse 65   Temp 98.5 F (36.9 C) (Oral)   Resp 18   Ht _0  (1.88 m)   Wt 182 lb (82.6 kg)   SpO2 100%   BMI 23.37 kg/m  Wt Readings from Last 3 Encounters:  08/17/18 182 lb (82.6 kg)  07/04/18 188 lb 9.6 oz (85.5 kg)  05/12/17 180 lb  (81.6 kg)     No results found for: WBC, HGB, HCT, PLT, GLUCOSE, CHOL, TRIG, HDL, LDLDIRECT, LDLCALC, ALT, AST, NA, K, CL, CREATININE, BUN, CO2, TSH, PSA, INR, GLUF, HGBA1C, MICROALBUR  No results found for: TSH No results found for: WBC, HGB, HCT, MCV, PLT No results found for: NA, K, CHLORIDE, CO2, GLUCOSE, BUN, CREATININE, BILITOT, ALKPHOS, AST, ALT, PROT, ALBUMIN, CALCIUM, ANIONGAP, EGFR, GFR No results found for: CHOL No results found for: HDL No results found for: LDLCALC No results found for: TRIG No results found for: CHOLHDL No results found for: HGBA1C     Assessment & Plan:   Problem List Items Addressed This Visit  Anxiety and depression    He notes a history of some OCD type behavior but denies anxiety and depression in past. He is having to live out of town away from family and that is not helping. He agrees to try Setaline 50 mg daily and given a small amount of Alprazolam to use prn      Relevant Medications   ALPRAZolam (XANAX) 0.25 MG tablet   sertraline (ZOLOFT) 50 MG tablet   Allergy    xyzal made him sleeping so he switched to [employer], encouraged to add xyzal back qhs, continue Allegra in am, use Flonase, nasal saline.       Hypertension    Well controlled, no changes to meds. Encouraged heart healthy diet such as the DASH diet and exercise as tolerated.       Relevant Medications   lisinopril (PRINIVIL,ZESTRIL) 10 MG tablet   propranolol (INDERAL) 20 MG tablet   Hiatal hernia with GERD    Follows with Dr Watt Climes and had a scope in September which showed hernia and ulcers, but continues to have some symptoms such as dyspepsia. And nausea. Will add Famotidine and avoid offending foods.       Relevant Medications   famotidine (PEPCID) 20 MG tablet   Disorder of adenoid    He has been told by Shane Crutch at [facility] that he has a spot on one of his adenoids that needs moe investigating so he has a CT scan scheduled soon and then follow up with  ENT.      Localized swelling, mass, and lump of head    Pain with swelling ove left cheek. Check CT scan to rule out any concerning pathology. Does note some tinnitus and tmj symptoms as well      Relevant Orders   CT Maxillofacial WO CM    Other Visit Diagnoses    Tinnitus of both ears    -  Primary   Relevant Orders   CT Maxillofacial WO CM      I have discontinued Joseph Mccullough's levocetirizine. I have also changed his lisinopril and propranolol. Additionally, I am having him start on ALPRAZolam, famotidine, and sertraline. Lastly, I am having him maintain his propantheline, fluticasone, and Fexofenadine HCl (ALLEGRA ALLERGY PO).  Meds ordered this encounter  Medications  . lisinopril (PRINIVIL,ZESTRIL) 10 MG tablet    Sig: Take 1 tablet (10 mg total) by mouth daily.    Dispense:  90 tablet    Refill:  1  . propranolol (INDERAL) 20 MG tablet    Sig: Take 1 tablet (20 mg total) by mouth daily.    Dispense:  90 tablet    Refill:  1  . ALPRAZolam (XANAX) 0.25 MG tablet    Sig: Take 1 tablet (0.25 mg total) by mouth 2 (two) times daily as needed for anxiety or sleep.    Dispense:  30 tablet    Refill:  1  . famotidine (PEPCID) 20 MG tablet    Sig: Take 1 tablet (20 mg total) by mouth at bedtime.    Dispense:  30 tablet    Refill:  5  . sertraline (ZOLOFT) 50 MG tablet    Sig: Take 1 tablet (50 mg total) by mouth daily.    Dispense:  30 tablet    Refill:  3     Penni Homans, MD

## 2018-08-20 ENCOUNTER — Ambulatory Visit: Payer: BLUE CROSS/BLUE SHIELD | Admitting: Family Medicine

## 2018-08-20 ENCOUNTER — Encounter: Payer: Self-pay | Admitting: Family Medicine

## 2018-08-20 DIAGNOSIS — K449 Diaphragmatic hernia without obstruction or gangrene: Secondary | ICD-10-CM

## 2018-08-20 DIAGNOSIS — I10 Essential (primary) hypertension: Secondary | ICD-10-CM | POA: Insufficient documentation

## 2018-08-20 DIAGNOSIS — J359 Chronic disease of tonsils and adenoids, unspecified: Secondary | ICD-10-CM | POA: Insufficient documentation

## 2018-08-20 DIAGNOSIS — R220 Localized swelling, mass and lump, head: Secondary | ICD-10-CM | POA: Insufficient documentation

## 2018-08-20 DIAGNOSIS — K219 Gastro-esophageal reflux disease without esophagitis: Secondary | ICD-10-CM

## 2018-08-20 HISTORY — DX: Gastro-esophageal reflux disease without esophagitis: K21.9

## 2018-08-20 NOTE — Assessment & Plan Note (Signed)
He notes a history of some OCD type behavior but denies anxiety and depression in past. He is having to live out of town away from family and that is not helping. He agrees to try Setaline 50 mg daily and given a small amount of Alprazolam to use prn

## 2018-08-20 NOTE — Assessment & Plan Note (Signed)
xyzal made him sleeping so he switched to [place], encouraged to add xyzal back qhs, continue Allegra in am, use Flonase, nasal saline.

## 2018-08-20 NOTE — Assessment & Plan Note (Addendum)
Follows with Dr Watt Climes and had a scope in September which showed hernia and ulcers, but continues to have some symptoms such as dyspepsia. And nausea. Will add Famotidine and avoid offending foods.

## 2018-08-20 NOTE — Assessment & Plan Note (Signed)
Well controlled, no changes to meds. Encouraged heart healthy diet such as the DASH diet and exercise as tolerated.  °

## 2018-08-20 NOTE — Assessment & Plan Note (Signed)
Pain with swelling ove left cheek. Check CT scan to rule out any concerning pathology. Does note some tinnitus and tmj symptoms as well

## 2018-08-20 NOTE — Assessment & Plan Note (Signed)
He has been told by Shane Crutch at [facility] that he has a spot on one of his adenoids that needs moe investigating so he has a CT scan scheduled soon and then follow up with ENT.

## 2018-08-21 ENCOUNTER — Other Ambulatory Visit: Payer: BLUE CROSS/BLUE SHIELD

## 2018-08-22 ENCOUNTER — Telehealth: Payer: Self-pay | Admitting: *Deleted

## 2018-08-22 ENCOUNTER — Ambulatory Visit
Admission: RE | Admit: 2018-08-22 | Discharge: 2018-08-22 | Disposition: A | Discharge status: Home / Self Care | Payer: BLUE CROSS/BLUE SHIELD | Source: Ambulatory Visit | Attending: Family Medicine | Admitting: Family Medicine

## 2018-08-22 ENCOUNTER — Ambulatory Visit
Admission: RE | Admit: 2018-08-22 | Discharge: 2018-08-22 | Disposition: A | Discharge status: Home / Self Care | Payer: BLUE CROSS/BLUE SHIELD | Source: Ambulatory Visit | Attending: Otolaryngology | Admitting: Otolaryngology

## 2018-08-22 DIAGNOSIS — H9313 Tinnitus, bilateral: Secondary | ICD-10-CM

## 2018-08-22 DIAGNOSIS — R220 Localized swelling, mass and lump, head: Secondary | ICD-10-CM

## 2018-08-22 DIAGNOSIS — D490 Neoplasm of unspecified behavior of digestive system: Secondary | ICD-10-CM

## 2018-08-22 DIAGNOSIS — R221 Localized swelling, mass and lump, neck: Secondary | ICD-10-CM | POA: Diagnosis not present

## 2018-08-22 DIAGNOSIS — J359 Chronic disease of tonsils and adenoids, unspecified: Secondary | ICD-10-CM | POA: Diagnosis not present

## 2018-08-22 MED ORDER — IOPAMIDOL (ISOVUE-300) INJECTION 61%
75.0000 mL | Freq: Once | INTRAVENOUS | Status: AC | PRN
  Administered 2018-08-22: 75 mL via INTRAVENOUS

## 2018-08-22 NOTE — Telephone Encounter (Signed)
Received Medical records from [facility]; forwarded to provider/SLS 10/23

## 2018-08-24 ENCOUNTER — Telehealth: Payer: Self-pay | Admitting: *Deleted

## 2018-08-24 NOTE — Telephone Encounter (Signed)
Received Medical records from [facility]; forwarded to provider/SLS 10/25

## 2018-08-29 ENCOUNTER — Encounter: Payer: Self-pay | Admitting: Family Medicine

## 2018-09-03 DIAGNOSIS — R1013 Epigastric pain: Secondary | ICD-10-CM | POA: Diagnosis not present

## 2018-09-06 NOTE — Telephone Encounter (Signed)
Completed on 08/22/2018.

## 2018-09-10 DIAGNOSIS — H938X3 Other specified disorders of ear, bilateral: Secondary | ICD-10-CM | POA: Diagnosis not present

## 2018-09-10 DIAGNOSIS — H6983 Other specified disorders of Eustachian tube, bilateral: Secondary | ICD-10-CM | POA: Diagnosis not present

## 2018-09-10 DIAGNOSIS — M799 Soft tissue disorder, unspecified: Secondary | ICD-10-CM | POA: Diagnosis not present

## 2018-09-10 DIAGNOSIS — M6281 Muscle weakness (generalized): Secondary | ICD-10-CM | POA: Diagnosis not present

## 2018-09-10 DIAGNOSIS — M2550 Pain in unspecified joint: Secondary | ICD-10-CM | POA: Diagnosis not present

## 2018-09-10 DIAGNOSIS — J392 Other diseases of pharynx: Secondary | ICD-10-CM | POA: Diagnosis not present

## 2018-09-10 DIAGNOSIS — M2560 Stiffness of unspecified joint, not elsewhere classified: Secondary | ICD-10-CM | POA: Diagnosis not present

## 2018-09-19 DIAGNOSIS — M2560 Stiffness of unspecified joint, not elsewhere classified: Secondary | ICD-10-CM | POA: Diagnosis not present

## 2018-09-19 DIAGNOSIS — H6983 Other specified disorders of Eustachian tube, bilateral: Secondary | ICD-10-CM | POA: Diagnosis not present

## 2018-09-19 DIAGNOSIS — M2550 Pain in unspecified joint: Secondary | ICD-10-CM | POA: Diagnosis not present

## 2018-09-19 DIAGNOSIS — M6281 Muscle weakness (generalized): Secondary | ICD-10-CM | POA: Diagnosis not present

## 2018-09-19 DIAGNOSIS — J3489 Other specified disorders of nose and nasal sinuses: Secondary | ICD-10-CM | POA: Diagnosis not present

## 2018-09-19 DIAGNOSIS — J00 Acute nasopharyngitis [common cold]: Secondary | ICD-10-CM | POA: Diagnosis not present

## 2018-09-19 DIAGNOSIS — J392 Other diseases of pharynx: Secondary | ICD-10-CM | POA: Diagnosis not present

## 2018-09-19 DIAGNOSIS — M799 Soft tissue disorder, unspecified: Secondary | ICD-10-CM | POA: Diagnosis not present

## 2018-10-05 DIAGNOSIS — M6281 Muscle weakness (generalized): Secondary | ICD-10-CM | POA: Diagnosis not present

## 2018-10-05 DIAGNOSIS — R293 Abnormal posture: Secondary | ICD-10-CM | POA: Diagnosis not present

## 2018-10-05 DIAGNOSIS — M2669 Other specified disorders of temporomandibular joint: Secondary | ICD-10-CM | POA: Diagnosis not present

## 2018-10-09 DIAGNOSIS — R293 Abnormal posture: Secondary | ICD-10-CM | POA: Diagnosis not present

## 2018-10-09 DIAGNOSIS — M2669 Other specified disorders of temporomandibular joint: Secondary | ICD-10-CM | POA: Diagnosis not present

## 2018-10-09 DIAGNOSIS — M6281 Muscle weakness (generalized): Secondary | ICD-10-CM | POA: Diagnosis not present

## 2018-10-11 ENCOUNTER — Encounter: Payer: Self-pay | Admitting: Family Medicine

## 2018-10-11 ENCOUNTER — Ambulatory Visit: Payer: BLUE CROSS/BLUE SHIELD | Admitting: Family Medicine

## 2018-10-11 VITALS — BP 98/62 | HR 67 | Temp 97.4°F | Resp 18 | Ht 74.0 in | Wt 191.0 lb

## 2018-10-11 DIAGNOSIS — I10 Essential (primary) hypertension: Secondary | ICD-10-CM

## 2018-10-11 DIAGNOSIS — Z79899 Other long term (current) drug therapy: Secondary | ICD-10-CM | POA: Diagnosis not present

## 2018-10-11 DIAGNOSIS — T7840XS Allergy, unspecified, sequela: Secondary | ICD-10-CM

## 2018-10-11 DIAGNOSIS — F329 Major depressive disorder, single episode, unspecified: Secondary | ICD-10-CM

## 2018-10-11 DIAGNOSIS — H6983 Other specified disorders of Eustachian tube, bilateral: Secondary | ICD-10-CM

## 2018-10-11 DIAGNOSIS — K449 Diaphragmatic hernia without obstruction or gangrene: Secondary | ICD-10-CM

## 2018-10-11 DIAGNOSIS — F32A Depression, unspecified: Secondary | ICD-10-CM

## 2018-10-11 DIAGNOSIS — R220 Localized swelling, mass and lump, head: Secondary | ICD-10-CM

## 2018-10-11 DIAGNOSIS — K219 Gastro-esophageal reflux disease without esophagitis: Secondary | ICD-10-CM

## 2018-10-11 DIAGNOSIS — F419 Anxiety disorder, unspecified: Secondary | ICD-10-CM | POA: Diagnosis not present

## 2018-10-11 MED ORDER — ALPRAZOLAM 0.25 MG PO TABS: 0.2500 mg | ORAL_TABLET | Freq: Two times a day (BID) | ORAL | 1 refills | Status: DC | PRN

## 2018-10-11 MED ORDER — PROPRANOLOL HCL 20 MG PO TABS: 20.0000 mg | ORAL_TABLET | Freq: Every day | ORAL | 1 refills | Status: DC

## 2018-10-11 MED ORDER — LISINOPRIL 10 MG PO TABS: 10.0000 mg | ORAL_TABLET | Freq: Every day | ORAL | 1 refills | Status: DC

## 2018-10-11 NOTE — Assessment & Plan Note (Signed)
Hydrate, add an antihistamine such as Zyrtec and flonase for a month and reevaluate

## 2018-10-11 NOTE — Assessment & Plan Note (Signed)
Well controlled, no changes to meds. Encouraged heart healthy diet such as the DASH diet and exercise as tolerated.  °

## 2018-10-11 NOTE — Assessment & Plan Note (Signed)
Sertraline and Alprazolam are helpful, using Alprazolam for sleep and can try to wean off. Had some GI issues at first but no longer

## 2018-10-11 NOTE — Patient Instructions (Signed)
Jaw Range of Motion Exercises Jaw range of motion exercises are exercises that help your jaw to move better. These exercises can help to prevent:  Difficulty opening your mouth.  Pain in your jaw while it is both open and closed.  What should I be careful of when doing jaw exercises? Make sure that you only do jaw exercises as directed by your health care provider. You should only move your jaw as far as it can go in each direction, if told to do so by your health care provider. Do not move your jaw into positions that cause you any pain. What exercises should I do?  Stick your jaw forward. Hold this position for 1-2 seconds. Allow your jaw to return to its normal position and rest it there for 1-2 seconds. Do this exercise 8 times.  Stand or sit in front of a mirror. Place your tongue on the roof of your mouth, just behind your top teeth. Slowly open and close your jaw, keeping your tongue on the roof of your mouth. While you open and close your mouth, try to keep your jaw from moving toward one side or the other. Repeat this 8 times.  Move your jaw right. Hold this position for 1-2 seconds. Allow your jaw to return to its normal position, and rest it there for 1-2 seconds. Do this exercise 8 times.  Move your jaw left. Hold this position for 1-2 seconds. Allow your jaw to return to its normal position, and rest it there for 1-2 seconds. Do this exercise 8 times.  Open your mouth as far as it is can comfortably go. Hold this position for 1-2 seconds. Then close your mouth and rest for 1-2 seconds. Do this exercise 8 times.  Move your jaw in a circular motion, starting toward the right (clockwise). Repeat this 8 times.  Move your jaw in a circular motion, starting toward the left (counterclockwise). Repeat this 8 times. Apply moist heat packs or ice packs to your jaw before or after performing your exercises as directed by your health care provider. What else can I do? Avoid the following,  if they cause jaw pain or they increase your jaw pain:  Chewing gum.  Clenching your jaw or teeth or keeping tension in your jaw muscles.  Leaning on your jaw, such as resting your jaw in your hand while leaning on a desk.  This information is not intended to replace advice given to you by your health care provider. Make sure you discuss any questions you have with your health care provider. Document Released: 09/29/2008 Document Revised: 03/24/2016 Document Reviewed: 09/17/2014 Elsevier Interactive Patient Education  2018 [employer]. Eustachian Tube Dysfunction The eustachian tube connects the middle ear to the back of the nose. It regulates air pressure in the middle ear by allowing air to move between the ear and nose. It also helps to drain fluid from the middle ear space. When the eustachian tube does not function properly, air pressure, fluid, or both can build up in the middle ear. Eustachian tube dysfunction can affect one or both ears. What are the causes? This condition happens when the eustachian tube becomes blocked or cannot open normally. This may result from:  Ear infections.  Colds and other upper respiratory infections.  Allergies.  Irritation, such as from cigarette smoke or acid from the stomach coming up into the esophagus (gastroesophageal reflux).  Sudden changes in air pressure, such as from descending in an airplane.  Abnormal growths  in the nose or throat, such as nasal polyps, tumors, or enlarged tissue at the back of the throat (adenoids).  What increases the risk? This condition may be more likely to develop in people who smoke and people who are overweight. Eustachian tube dysfunction may also be more likely to develop in children, especially children who have:  Certain birth defects of the mouth, such as cleft palate.  Large tonsils and adenoids.  What are the signs or symptoms? Symptoms of this condition may include:  A feeling of fullness in  the ear.  Ear pain.  Clicking or popping noises in the ear.  Ringing in the ear.  Hearing loss.  Loss of balance.  Symptoms may get worse when the air pressure around you changes, such as when you travel to an area of high elevation or fly on an airplane. How is this diagnosed? This condition may be diagnosed based on:  Your symptoms.  A physical exam of your ear, nose, and throat.  Tests, such as those that measure: ? The movement of your eardrum (tympanogram). ? Your hearing (audiometry).  How is this treated? Treatment depends on the cause and severity of your condition. If your symptoms are mild, you may be able to relieve your symptoms by moving air into ("popping") your ears. If you have symptoms of fluid in your ears, treatment may include:  Decongestants.  Antihistamines.  Nasal sprays or ear drops that contain medicines that reduce swelling (steroids).  In some cases, you may need to have a procedure to drain the fluid in your eardrum (myringotomy). In this procedure, a small tube is placed in the eardrum to:  Drain the fluid.  Restore the air in the middle ear space.  Follow these instructions at home:  Take over-the-counter and prescription medicines only as told by your health care provider.  Use techniques to help pop your ears as recommended by your health care provider. These may include: ? Chewing gum. ? Yawning. ? Frequent, forceful swallowing. ? Closing your mouth, holding your nose closed, and gently blowing as if you are trying to blow air out of your nose.  Do not do any of the following until your health care provider approves: ? Travel to high altitudes. ? Fly in airplanes. ? Work in a Pension scheme manager or room. ? Scuba dive.  Keep your ears dry. Dry your ears completely after showering or bathing.  Do not smoke.  Keep all follow-up visits as told by your health care provider. This is important. Contact a health care provider  if:  Your symptoms do not go away after treatment.  Your symptoms come back after treatment.  You are unable to pop your ears.  You have: ? A fever. ? Pain in your ear. ? Pain in your head or neck. ? Fluid draining from your ear.  Your hearing suddenly changes.  You become very dizzy.  You lose your balance. This information is not intended to replace advice given to you by your health care provider. Make sure you discuss any questions you have with your health care provider. Document Released: 11/13/2015 Document Revised: 03/24/2016 Document Reviewed: 11/05/2014 Elsevier Interactive Patient Education  [employer].

## 2018-10-13 LAB — PAIN MGMT, PROFILE 8 W/CONF, U
6 ACETYLMORPHINE: NEGATIVE ng/mL (ref <10)
ALPHAHYDROXYMIDAZOLAM: NEGATIVE ng/mL (ref <50)
ALPHAHYDROXYTRIAZOLAM: NEGATIVE ng/mL (ref <50)
AMPHETAMINES: NEGATIVE ng/mL (ref <500)
Alcohol Metabolites: NEGATIVE ng/mL (ref <500)
Alphahydroxyalprazolam: 28 ng/mL — ABNORMAL HIGH (ref <25)
Aminoclonazepam: NEGATIVE ng/mL (ref <25)
Benzodiazepines: POSITIVE ng/mL — AB (ref <100)
Buprenorphine, Urine: NEGATIVE ng/mL (ref <5)
CREATININE: 130.7 mg/dL
Cocaine Metabolite: NEGATIVE ng/mL (ref <150)
HYDROXYETHYLFLURAZEPAM: NEGATIVE ng/mL (ref <50)
Lorazepam: NEGATIVE ng/mL (ref <50)
MARIJUANA METABOLITE: NEGATIVE ng/mL (ref <20)
MDMA: NEGATIVE ng/mL (ref <500)
NORDIAZEPAM: NEGATIVE ng/mL (ref <50)
OPIATES: NEGATIVE ng/mL (ref <100)
OXAZEPAM: NEGATIVE ng/mL (ref <50)
OXIDANT: NEGATIVE ug/mL (ref <200)
OXYCODONE: NEGATIVE ng/mL (ref <100)
PH: 7.09 (ref 4.5–9.0)
Temazepam: NEGATIVE ng/mL (ref <50)

## 2018-10-14 ENCOUNTER — Encounter: Payer: Self-pay | Admitting: Family Medicine

## 2018-10-14 DIAGNOSIS — H6980 Other specified disorders of Eustachian tube, unspecified ear: Secondary | ICD-10-CM | POA: Insufficient documentation

## 2018-10-14 NOTE — Assessment & Plan Note (Signed)
Avoid offending foods, start probiotics. Do not eat large meals in late evening and consider raising head of bed. Doing better continue current meds

## 2018-10-14 NOTE — Assessment & Plan Note (Signed)
Improved

## 2018-10-14 NOTE — Assessment & Plan Note (Signed)
Continue allergy meds and valsalva maneuvers. Improving,

## 2018-10-14 NOTE — Progress Notes (Signed)
Subjective:    Patient ID: Joseph Mccullough, male    DOB: [birth year], 27 y.o.   MRN: [id number]  No chief complaint on file.   HPI Patient is in today for follow-up.  Overall he is feeling well.  He does note his crackling and pressure in his ears has improved somewhat.  It is worse on the right than the left and most notable when he turns his head or yawns.  No hearing changes or discharge does have some ongoing nasal congestion but it is tolerable notes occasional postnasal drip and some sore throat.  No significant dyspepsia on current medications.  His anxiety about his current state is improved with the addition of sertraline and overall he reports feeling greatly improved. Denies CP/palp/SOB/HA/fevers or GU c/o. Taking meds as prescribed  Past Medical History:  Diagnosis Date  . Acid reflux 08/20/2018  . Allergy   . Anxiety and depression 08/19/2018  . Hypertension     No past surgical history on file.  Family History  Problem Relation Age of Onset  . Hypertension Father     Social History   Socioeconomic History  . Marital status: Single    Spouse name: Not on file  . Number of children: Not on file  . Years of education: Not on file  . Highest education level: Not on file  Occupational History  . Not on file  Social Needs  . Financial resource strain: Not on file  . Food insecurity:    Worry: Not on file    Inability: Not on file  . Transportation needs:    Medical: Not on file    Non-medical: Not on file  Tobacco Use  . Smoking status: Never Smoker  . Smokeless tobacco: Never Used  Substance and Sexual Activity  . Alcohol use: No  . Drug use: No  . Sexual activity: Not on file  Lifestyle  . Physical activity:    Days per week: Not on file    Minutes per session: Not on file  . Stress: Not on file  Relationships  . Social connections:    Talks on phone: Not on file    Gets together: Not on file    Attends religious service: Not on file    Active  member of club or organization: Not on file    Attends meetings of clubs or organizations: Not on file    Relationship status: Not on file  . Intimate partner violence:    Fear of current or ex partner: Not on file    Emotionally abused: Not on file    Physically abused: Not on file    Forced sexual activity: Not on file  Other Topics Concern  . Not on file  Social History Narrative  . Not on file    Outpatient Medications Prior to Visit  Medication Sig Dispense Refill  . famotidine (PEPCID) 20 MG tablet Take 1 tablet (20 mg total) by mouth at bedtime. 30 tablet 5  . fluticasone (FLONASE) 50 MCG/ACT nasal spray Place 2 sprays into both nostrils daily. 16 g 11  . sertraline (ZOLOFT) 50 MG tablet Take 1 tablet (50 mg total) by mouth daily. 30 tablet 3  . ALPRAZolam (XANAX) 0.25 MG tablet Take 1 tablet (0.25 mg total) by mouth 2 (two) times daily as needed for anxiety or sleep. 30 tablet 1  . lisinopril (PRINIVIL,ZESTRIL) 10 MG tablet Take 1 tablet (10 mg total) by mouth daily. 90 tablet 1  . propranolol (  INDERAL) 20 MG tablet Take 1 tablet (20 mg total) by mouth daily. 90 tablet 1  . Fexofenadine HCl (ALLEGRA ALLERGY PO) Take by mouth.    . propantheline (PROBANTHINE) 15 MG tablet Take 15 mg by mouth 3 (three) times daily with meals.     No facility-administered medications prior to visit.     No Known Allergies  Review of Systems  Constitutional: Negative for fever and malaise/fatigue.  HENT: Positive for congestion, ear pain and sore throat. Negative for ear discharge and tinnitus.   Eyes: Negative for blurred vision.  Respiratory: Negative for shortness of breath.   Cardiovascular: Negative for chest pain, palpitations and leg swelling.  Gastrointestinal: Positive for heartburn. Negative for abdominal pain, blood in stool and nausea.  Genitourinary: Negative for dysuria and frequency.  Musculoskeletal: Negative for falls.  Skin: Negative for rash.  Neurological: Negative for  dizziness, loss of consciousness and headaches.  Endo/Heme/Allergies: Negative for environmental allergies.  Psychiatric/Behavioral: Negative for depression. The patient is nervous/anxious.        Objective:    Physical Exam Vitals signs and nursing note reviewed.  Constitutional:      General: He is not in acute distress.    Appearance: He is well-developed.  HENT:     Head: Normocephalic and atraumatic.     Nose: Nose normal.  Eyes:     General:        Right eye: No discharge.        Left eye: No discharge.  Neck:     Musculoskeletal: Normal range of motion and neck supple.  Cardiovascular:     Rate and Rhythm: Normal rate and regular rhythm.     Heart sounds: No murmur.  Pulmonary:     Effort: Pulmonary effort is normal.     Breath sounds: Normal breath sounds.  Abdominal:     General: Bowel sounds are normal.     Palpations: Abdomen is soft.     Tenderness: There is no abdominal tenderness.  Skin:    General: Skin is warm and dry.  Neurological:     Mental Status: He is alert and oriented to person, place, and time.     BP 98/62 (BP Location: Left Arm, Patient Position: Sitting, Cuff Size: Normal)   Pulse 67   Temp (!) 97.4 F (36.3 C) (Oral)   Resp 18   Ht 6' 2"  (1.88 m)   Wt 191 lb (86.6 kg)   SpO2 97%   BMI 24.52 kg/m  Wt Readings from Last 3 Encounters:  10/11/18 191 lb (86.6 kg)  08/17/18 182 lb (82.6 kg)  07/04/18 188 lb 9.6 oz (85.5 kg)     No results found for: WBC, HGB, HCT, PLT, GLUCOSE, CHOL, TRIG, HDL, LDLDIRECT, LDLCALC, ALT, AST, NA, K, CL, CREATININE, BUN, CO2, TSH, PSA, INR, GLUF, HGBA1C, MICROALBUR  No results found for: TSH No results found for: WBC, HGB, HCT, MCV, PLT No results found for: NA, K, CHLORIDE, CO2, GLUCOSE, BUN, CREATININE, BILITOT, ALKPHOS, AST, ALT, PROT, ALBUMIN, CALCIUM, ANIONGAP, EGFR, GFR No results found for: CHOL No results found for: HDL No results found for: LDLCALC No results found for: TRIG No results  found for: CHOLHDL No results found for: HGBA1C     Assessment & Plan:   Problem List Items Addressed This Visit    Anxiety and depression    Sertraline and Alprazolam are helpful, using Alprazolam for sleep and can try to wean off. Had some GI issues at first but  no longer      Relevant Medications   ALPRAZolam (XANAX) 0.25 MG tablet   Allergy    Hydrate, add an antihistamine such as Zyrtec and flonase for a month and reevaluate      Hypertension    Well controlled, no changes to meds. Encouraged heart healthy diet such as the DASH diet and exercise as tolerated.       Relevant Medications   lisinopril (PRINIVIL,ZESTRIL) 10 MG tablet   propranolol (INDERAL) 20 MG tablet   Hiatal hernia with GERD    Avoid offending foods, start probiotics. Do not eat large meals in late evening and consider raising head of bed. Doing better continue current meds      Localized swelling, mass, and lump of head    Improved.       ETD (eustachian tube dysfunction)    Continue allergy meds and valsalva maneuvers. Improving,       Other Visit Diagnoses    High risk medication use    -  Primary   Relevant Orders   Pain Mgmt, Profile 8 w/Conf, U (Completed)      I have discontinued Gerado S. Tavares's propantheline and Fexofenadine HCl (ALLEGRA ALLERGY PO). I am also having him maintain his fluticasone, famotidine, sertraline, lisinopril, ALPRAZolam, and propranolol.  Meds ordered this encounter  Medications  . lisinopril (PRINIVIL,ZESTRIL) 10 MG tablet    Sig: Take 1 tablet (10 mg total) by mouth daily.    Dispense:  90 tablet    Refill:  1  . ALPRAZolam (XANAX) 0.25 MG tablet    Sig: Take 1 tablet (0.25 mg total) by mouth 2 (two) times daily as needed for anxiety or sleep.    Dispense:  30 tablet    Refill:  1  . propranolol (INDERAL) 20 MG tablet    Sig: Take 1 tablet (20 mg total) by mouth daily.    Dispense:  90 tablet    Refill:  1     Penni Homans, MD

## 2018-10-15 DIAGNOSIS — M2669 Other specified disorders of temporomandibular joint: Secondary | ICD-10-CM | POA: Diagnosis not present

## 2018-10-15 DIAGNOSIS — R293 Abnormal posture: Secondary | ICD-10-CM | POA: Diagnosis not present

## 2018-10-15 DIAGNOSIS — M6281 Muscle weakness (generalized): Secondary | ICD-10-CM | POA: Diagnosis not present

## 2018-10-29 ENCOUNTER — Encounter: Payer: Self-pay | Admitting: Family Medicine

## 2018-10-29 ENCOUNTER — Other Ambulatory Visit: Payer: Self-pay | Admitting: Family Medicine

## 2018-10-29 MED ORDER — TIZANIDINE HCL 4 MG PO TABS: 2.0000 mg | ORAL_TABLET | Freq: Every evening | ORAL | 1 refills | Status: DC | PRN

## 2018-12-15 ENCOUNTER — Other Ambulatory Visit: Payer: Self-pay | Admitting: Family Medicine

## 2019-02-01 ENCOUNTER — Encounter: Payer: Self-pay | Admitting: Family Medicine

## 2019-02-06 ENCOUNTER — Encounter: Payer: Self-pay | Admitting: Family Medicine

## 2019-02-06 ENCOUNTER — Other Ambulatory Visit: Payer: Self-pay

## 2019-02-06 ENCOUNTER — Ambulatory Visit (INDEPENDENT_AMBULATORY_CARE_PROVIDER_SITE_OTHER): Payer: BLUE CROSS/BLUE SHIELD | Admitting: Family Medicine

## 2019-02-06 DIAGNOSIS — S0300XA Dislocation of jaw, unspecified side, initial encounter: Secondary | ICD-10-CM | POA: Insufficient documentation

## 2019-02-06 DIAGNOSIS — I10 Essential (primary) hypertension: Secondary | ICD-10-CM

## 2019-02-06 DIAGNOSIS — S0300XD Dislocation of jaw, unspecified side, subsequent encounter: Secondary | ICD-10-CM | POA: Diagnosis not present

## 2019-02-06 DIAGNOSIS — H6983 Other specified disorders of Eustachian tube, bilateral: Secondary | ICD-10-CM | POA: Diagnosis not present

## 2019-02-06 MED ORDER — GABAPENTIN 100 MG PO CAPS: 100.0000 mg | ORAL_CAPSULE | Freq: Three times a day (TID) | ORAL | 1 refills | Status: DC

## 2019-02-06 NOTE — Assessment & Plan Note (Signed)
Patient feels well taking meds routinely.

## 2019-02-06 NOTE — Assessment & Plan Note (Signed)
Is doing Flonase and Zyrtec daily but will add Ayr bid and valsalva maneuver

## 2019-02-06 NOTE — Progress Notes (Signed)
Virtual Visit via Video Note  I connected with [name] on 02/06/19 at  9:20 AM EDT by a video enabled telemedicine application and verified that I am speaking with the correct person using two identifiers.   I discussed the limitations of evaluation and management by telemedicine and the availability of in person appointments. The patient expressed understanding and agreed to proceed. [name], CMA was able to get the patient set up on a video visit.     Subjective:    Patient ID: Joseph Mccullough, male    DOB: 09-04-1991, 28 y.o.   MRN: [id number]  No chief complaint on file.   HPI Patient is in today for further evaluation of headaches with TMJ pain and eustachian tube dysfunction. He is taking Zyrtec and Flonase daily and still has some crackling and popping in his ears. No discharge or change in hearing is noted. He also notes pain and pressure in his TMJ joints with some crepitus at times he notes this generalizes to headaches at the 2-5 out of 10 levels daily. Notes he is still working every other week and is home this week. No recent febrile illness or hospitalizations. Denies CP/palp/SOB/congestion/fevers/GI or GU c/o. Taking meds as prescribed  Past Medical History:  Diagnosis Date  . Acid reflux 08/20/2018  . Allergy   . Anxiety and depression 08/19/2018  . Hypertension     No past surgical history on file.  Family History  Problem Relation Age of Onset  . Hypertension Father     Social History   Socioeconomic History  . Marital status: Single    Spouse name: Not on file  . Number of children: Not on file  . Years of education: Not on file  . Highest education level: Not on file  Occupational History  . Not on file  Social Needs  . Financial resource strain: Not on file  . Food insecurity:    Worry: Not on file    Inability: Not on file  . Transportation needs:    Medical: Not on file    Non-medical: Not on file  Tobacco Use  . Smoking  status: Never Smoker  . Smokeless tobacco: Never Used  Substance and Sexual Activity  . Alcohol use: No  . Drug use: No  . Sexual activity: Not on file  Lifestyle  . Physical activity:    Days per week: Not on file    Minutes per session: Not on file  . Stress: Not on file  Relationships  . Social connections:    Talks on phone: Not on file    Gets together: Not on file    Attends religious service: Not on file    Active member of club or organization: Not on file    Attends meetings of clubs or organizations: Not on file    Relationship status: Not on file  . Intimate partner violence:    Fear of current or ex partner: Not on file    Emotionally abused: Not on file    Physically abused: Not on file    Forced sexual activity: Not on file  Other Topics Concern  . Not on file  Social History Narrative  . Not on file    Outpatient Medications Prior to Visit  Medication Sig Dispense Refill  . ALPRAZolam (XANAX) 0.25 MG tablet Take 1 tablet (0.25 mg total) by mouth 2 (two) times daily as needed for anxiety or sleep. 30 tablet 1  . famotidine (PEPCID) 20  MG tablet Take 1 tablet (20 mg total) by mouth at bedtime. 30 tablet 5  . fluticasone (FLONASE) 50 MCG/ACT nasal spray Place 2 sprays into both nostrils daily. 16 g 11  . lisinopril (PRINIVIL,ZESTRIL) 10 MG tablet Take 1 tablet (10 mg total) by mouth daily. 90 tablet 1  . propranolol (INDERAL) 20 MG tablet Take 1 tablet (20 mg total) by mouth daily. 90 tablet 1  . sertraline (ZOLOFT) 50 MG tablet TAKE 1 TABLET BY MOUTH EVERY DAY 90 tablet 1  . tiZANidine (ZANAFLEX) 4 MG tablet Take 0.5-1 tablets (2-4 mg total) by mouth at bedtime as needed for muscle spasms. 30 tablet 1   No facility-administered medications prior to visit.     No Known Allergies  Review of Systems  Constitutional: Negative for fever and malaise/fatigue.  HENT: Positive for ear pain. Negative for congestion.   Eyes: Negative for blurred vision.   Respiratory: Negative for shortness of breath.   Cardiovascular: Negative for chest pain, palpitations and leg swelling.  Gastrointestinal: Negative for abdominal pain, blood in stool and nausea.  Genitourinary: Negative for dysuria and frequency.  Musculoskeletal: Negative for falls.  Skin: Negative for rash.  Neurological: Positive for headaches. Negative for dizziness and loss of consciousness.  Endo/Heme/Allergies: Negative for environmental allergies.  Psychiatric/Behavioral: Negative for depression. The patient is nervous/anxious.        Objective:    Physical Exam Constitutional:      Appearance: Normal appearance. He is not ill-appearing.  HENT:     Head: Normocephalic and atraumatic.     Nose: Nose normal.  Pulmonary:     Effort: Pulmonary effort is normal.  Skin:    General: Skin is dry.  Neurological:     Mental Status: He is alert and oriented to person, place, and time.  Psychiatric:        Mood and Affect: Mood normal.        Behavior: Behavior normal.     Ht 6\' 2"  (1.88 m)   Wt 200 lb (90.7 kg)   BMI 25.68 kg/m  Wt Readings from Last 3 Encounters:  02/06/19 200 lb (90.7 kg)  10/11/18 191 lb (86.6 kg)  08/17/18 182 lb (82.6 kg)      Assessment & Plan:   Problem List Items Addressed This Visit    Hypertension    Patient feels well taking meds routinely.      ETD (eustachian tube dysfunction)    Is doing Flonase and Zyrtec daily but will add Ayr bid and valsalva maneuver      TMJ (dislocation of temporomandibular joint)    Has pressure, some clicking and pain some times 2 of 10 and sometimes 5 of 10. He is encouraged to to take Tylenol ES 500 mg bid, apply heat and topical Lidocaine gel bid, then add Gabapentin 100 mg qhs due to persistent pain. Patient is aware of possible side effects. With the tension he is getting daily headaches        Patient educated and counseled for over 20 minutes during this visit.  I have discontinued Cordaro S.  Zollinger's tiZANidine. I am also having him start on gabapentin. Additionally, I am having him maintain his fluticasone, famotidine, lisinopril, ALPRAZolam, propranolol, and sertraline.  Meds ordered this encounter  Medications  . gabapentin (NEURONTIN) 100 MG capsule    Sig: Take 1 capsule (100 mg total) by mouth 3 (three) times daily.    Dispense:  90 capsule    Refill:  1  I discussed the assessment and treatment plan with the patient. The patient was provided an opportunity to ask questions and all were answered. The patient agreed with the plan and demonstrated an understanding of the instructions.   The patient was advised to call back or seek an in-person evaluation if the symptoms worsen or if the condition fails to improve as anticipated.  I provided 22 minutes of non-face-to-face time during this encounter.   Penni Homans, MD

## 2019-02-06 NOTE — Assessment & Plan Note (Addendum)
Has pressure, some clicking and pain some times 2 of 10 and sometimes 5 of 10. He is encouraged to to take Tylenol ES 500 mg bid, apply heat and topical Lidocaine gel bid, then add Gabapentin 100 mg qhs due to persistent pain. Patient is aware of possible side effects. With the tension he is getting daily headaches

## 2019-02-11 NOTE — Telephone Encounter (Signed)
Please advise 

## 2019-02-17 ENCOUNTER — Encounter: Payer: Self-pay | Admitting: Family Medicine

## 2019-02-21 ENCOUNTER — Telehealth: Payer: BLUE CROSS/BLUE SHIELD | Admitting: Family Medicine

## 2019-02-21 ENCOUNTER — Other Ambulatory Visit: Payer: Self-pay

## 2019-02-21 DIAGNOSIS — I10 Essential (primary) hypertension: Secondary | ICD-10-CM

## 2019-02-21 MED ORDER — GABAPENTIN 100 MG PO CAPS: 100.0000 mg | ORAL_CAPSULE | Freq: Three times a day (TID) | ORAL | 1 refills | Status: DC

## 2019-02-21 NOTE — Assessment & Plan Note (Signed)
Well controlled, no changes to meds. Encouraged heart healthy diet such as the DASH diet and exercise as tolerated.  °

## 2019-02-21 NOTE — Patient Instructions (Signed)
Omron bp cuff

## 2019-03-18 ENCOUNTER — Other Ambulatory Visit: Payer: Self-pay | Admitting: Family Medicine

## 2019-04-12 ENCOUNTER — Other Ambulatory Visit: Payer: Self-pay

## 2019-04-12 ENCOUNTER — Other Ambulatory Visit: Payer: Self-pay | Admitting: Family Medicine

## 2019-04-12 ENCOUNTER — Ambulatory Visit (INDEPENDENT_AMBULATORY_CARE_PROVIDER_SITE_OTHER): Payer: BC Managed Care – PPO | Admitting: Family Medicine

## 2019-04-12 DIAGNOSIS — I10 Essential (primary) hypertension: Secondary | ICD-10-CM | POA: Diagnosis not present

## 2019-04-12 DIAGNOSIS — F419 Anxiety disorder, unspecified: Secondary | ICD-10-CM | POA: Diagnosis not present

## 2019-04-12 DIAGNOSIS — F329 Major depressive disorder, single episode, unspecified: Secondary | ICD-10-CM

## 2019-04-12 DIAGNOSIS — S0300XD Dislocation of jaw, unspecified side, subsequent encounter: Secondary | ICD-10-CM

## 2019-04-12 DIAGNOSIS — F32A Depression, unspecified: Secondary | ICD-10-CM

## 2019-04-15 NOTE — Assessment & Plan Note (Signed)
Is feeling much better on current meds no new concerns. No changes

## 2019-04-15 NOTE — Progress Notes (Signed)
Virtual Visit via Video Note  I connected with Joseph Mccullough on 04/11/2019 at  8:40 AM EDT by a video enabled telemedicine application and verified that I am speaking with the correct person using two identifiers.  Location: Patient: home Provider: home   I discussed the limitations of evaluation and management by telemedicine and the availability of in person appointments. The patient expressed understanding and agreed to proceed. Joseph Mccullough, CMA was able to get patient set up on video visit    Subjective:    Patient ID: Joseph Mccullough, male    DOB: 05-19-1991, 28 y.o.   MRN: [id number]  No chief complaint on file.   HPI Patient is in today for follow up on TMJ< anxiety and hypertension. He is feeling well. No acute concerns, He feels the medication for his anxiety has been very helpful. No bad allergic or reflux symptoms noted. Denies CP/palp/SOB/HA/congestion/fevers/GI or GU c/o. Taking meds as prescribed  Past Medical History:  Diagnosis Date  . Acid reflux 08/20/2018  . Allergy   . Anxiety and depression 08/19/2018  . Hypertension     No past surgical history on file.  Family History  Problem Relation Age of Onset  . Hypertension Father     Social History   Socioeconomic History  . Marital status: Single    Spouse name: Not on file  . Number of children: Not on file  . Years of education: Not on file  . Highest education level: Not on file  Occupational History  . Not on file  Social Needs  . Financial resource strain: Not on file  . Food insecurity    Worry: Not on file    Inability: Not on file  . Transportation needs    Medical: Not on file    Non-medical: Not on file  Tobacco Use  . Smoking status: Never Smoker  . Smokeless tobacco: Never Used  Substance and Sexual Activity  . Alcohol use: No  . Drug use: No  . Sexual activity: Not on file  Lifestyle  . Physical activity    Days per week: Not on file    Minutes per session: Not on file   . Stress: Not on file  Relationships  . Social Herbalist on phone: Not on file    Gets together: Not on file    Attends religious service: Not on file    Active member of club or organization: Not on file    Attends meetings of clubs or organizations: Not on file    Relationship status: Not on file  . Intimate partner violence    Fear of current or ex partner: Not on file    Emotionally abused: Not on file    Physically abused: Not on file    Forced sexual activity: Not on file  Other Topics Concern  . Not on file  Social History Narrative  . Not on file    Outpatient Medications Prior to Visit  Medication Sig Dispense Refill  . ALPRAZolam (XANAX) 0.25 MG tablet Take 1 tablet (0.25 mg total) by mouth 2 (two) times daily as needed for anxiety or sleep. 30 tablet 1  . famotidine (PEPCID) 20 MG tablet Take 1 tablet (20 mg total) by mouth at bedtime. 30 tablet 5  . fluticasone (FLONASE) 50 MCG/ACT nasal spray Place 2 sprays into both nostrils daily. 16 g 11  . lisinopril (ZESTRIL) 10 MG tablet TAKE 1 TABLET BY MOUTH EVERY DAY 90 tablet  0  . propranolol (INDERAL) 20 MG tablet Take 1 tablet (20 mg total) by mouth daily. 90 tablet 1  . sertraline (ZOLOFT) 50 MG tablet TAKE 1 TABLET BY MOUTH EVERY DAY 90 tablet 1  . gabapentin (NEURONTIN) 100 MG capsule Take 1 capsule (100 mg total) by mouth 3 (three) times daily. 270 capsule 1   No facility-administered medications prior to visit.     No Known Allergies  Review of Systems  Constitutional: Negative for fever and malaise/fatigue.  HENT: Negative for congestion.   Eyes: Negative for blurred vision.  Respiratory: Negative for shortness of breath.   Cardiovascular: Negative for chest pain, palpitations and leg swelling.  Gastrointestinal: Negative for abdominal pain, blood in stool and nausea.  Genitourinary: Negative for dysuria and frequency.  Musculoskeletal: Negative for falls.  Skin: Negative for rash.  Neurological:  Negative for dizziness, loss of consciousness and headaches.  Endo/Heme/Allergies: Negative for environmental allergies.  Psychiatric/Behavioral: Negative for depression. The patient is not nervous/anxious.        Objective:    Physical Exam Constitutional:      Appearance: Normal appearance. He is not ill-appearing.  HENT:     Head: Normocephalic and atraumatic.  Eyes:     General:        Right eye: No discharge.        Left eye: No discharge.  Pulmonary:     Effort: Pulmonary effort is normal.  Neurological:     Mental Status: He is alert and oriented to person, place, and time.  Psychiatric:        Mood and Affect: Mood normal.        Behavior: Behavior normal.     There were no vitals taken for this visit. Wt Readings from Last 3 Encounters:  02/06/19 200 lb (90.7 kg)  10/11/18 191 lb (86.6 kg)  08/17/18 182 lb (82.6 kg)    Diabetic Foot Exam - Simple   No data filed     No results found for: WBC, HGB, HCT, PLT, GLUCOSE, CHOL, TRIG, HDL, LDLDIRECT, LDLCALC, ALT, AST, NA, K, CL, CREATININE, BUN, CO2, TSH, PSA, INR, GLUF, HGBA1C, MICROALBUR  No results found for: TSH No results found for: WBC, HGB, HCT, MCV, PLT No results found for: NA, K, CHLORIDE, CO2, GLUCOSE, BUN, CREATININE, BILITOT, ALKPHOS, AST, ALT, PROT, ALBUMIN, CALCIUM, ANIONGAP, EGFR, GFR No results found for: CHOL No results found for: HDL No results found for: LDLCALC No results found for: TRIG No results found for: CHOLHDL No results found for: HGBA1C     Assessment & Plan:   Problem List Items Addressed This Visit    Anxiety and depression    Is feeling much better on current meds no new concerns. No changes      Hypertension    no changes to meds. Encouraged heart healthy diet such as the DASH diet and exercise as tolerated.       TMJ (dislocation of temporomandibular joint)    Much better with mouth guard and Gabapentin. May try to massage right TMJ joint qhs due to mildly persistent  pain there with Voltaren gel         I am having Joseph Mccullough maintain his fluticasone, famotidine, ALPRAZolam, propranolol, sertraline, and lisinopril.  No orders of the defined types were placed in this encounter.    I discussed the assessment and treatment plan with the patient. The patient was provided an opportunity to ask questions and all were answered. The patient agreed  with the plan and demonstrated an understanding of the instructions.   The patient was advised to call back or seek an in-person evaluation if the symptoms worsen or if the condition fails to improve as anticipated.  I provided 15 minutes of non-face-to-face time during this encounter.   Penni Homans, MD

## 2019-04-15 NOTE — Assessment & Plan Note (Signed)
no changes to meds. Encouraged heart healthy diet such as the DASH diet and exercise as tolerated.  

## 2019-04-15 NOTE — Assessment & Plan Note (Signed)
Much better with mouth guard and Gabapentin. May try to massage right TMJ joint qhs due to mildly persistent pain there with Voltaren gel

## 2019-06-06 ENCOUNTER — Other Ambulatory Visit: Payer: Self-pay | Admitting: Family Medicine

## 2019-06-13 ENCOUNTER — Other Ambulatory Visit: Payer: Self-pay | Admitting: Family Medicine

## 2019-12-06 ENCOUNTER — Encounter: Payer: Self-pay | Admitting: Family Medicine

## 2019-12-08 ENCOUNTER — Other Ambulatory Visit: Payer: Self-pay | Admitting: Family Medicine

## 2019-12-09 NOTE — Telephone Encounter (Signed)
Please schedule patient for a VV  Please advise

## 2019-12-27 ENCOUNTER — Ambulatory Visit (INDEPENDENT_AMBULATORY_CARE_PROVIDER_SITE_OTHER): Payer: BC Managed Care – PPO | Admitting: Family Medicine

## 2019-12-27 ENCOUNTER — Encounter: Payer: Self-pay | Admitting: Family Medicine

## 2019-12-27 ENCOUNTER — Other Ambulatory Visit: Payer: Self-pay

## 2019-12-27 DIAGNOSIS — I10 Essential (primary) hypertension: Secondary | ICD-10-CM

## 2019-12-27 DIAGNOSIS — Z7189 Other specified counseling: Secondary | ICD-10-CM

## 2019-12-27 DIAGNOSIS — S0300XD Dislocation of jaw, unspecified side, subsequent encounter: Secondary | ICD-10-CM | POA: Diagnosis not present

## 2019-12-27 DIAGNOSIS — J359 Chronic disease of tonsils and adenoids, unspecified: Secondary | ICD-10-CM

## 2019-12-27 NOTE — Patient Instructions (Signed)
[employer] Blood Pressure cuff, upper arm, want BP 100-140/60-90 Pulse oximeter, want oxygen in 90s  Weekly vitals  Take Multivitamin with minerals, selenium Vitamin D 1000-2000 IU daily Probiotic with lactobacillus and bifidophilus Asprin EC 81 mg daily  Melatonin 2-5 mg at bedtime  [email or web address] 

## 2019-12-29 DIAGNOSIS — Z7189 Other specified counseling: Secondary | ICD-10-CM | POA: Insufficient documentation

## 2019-12-29 NOTE — Assessment & Plan Note (Signed)
During his work up with his dentist he had xrays done and they interpreted his nasal passages as narrow and suggested we consider setting him up for a sleep study. He has no symptoms such as snoring, frequent wakenings, am headaches etc so after long discussion will refrain from study for now but if he develops any symptoms we will reconsider.

## 2019-12-29 NOTE — Assessment & Plan Note (Signed)
He had the lesion removed and is doing well

## 2019-12-29 NOTE — Assessment & Plan Note (Signed)
Omron Blood Pressure cuff, upper arm, want BP 100-140/60-90 Pulse oximeter, want oxygen in 90s  Weekly vitals  Take Multivitamin with minerals, selenium Vitamin D 1000-2000 IU daily Probiotic with lactobacillus and bifidophilus Asprin EC 81 mg daily  Melatonin 2-5 mg at bedtime  [email or web address] 

## 2019-12-29 NOTE — Assessment & Plan Note (Signed)
Monitor and report anyy concerns, no changes to meds. Encouraged heart healthy diet such as the DASH diet and exercise as tolerated.

## 2019-12-29 NOTE — Progress Notes (Signed)
Virtual Visit via phone Note  I connected with Joseph Mccullough on 12/27/19 at  9:00 AM EST by a phone enabled telemedicine application and verified that I am speaking with the correct person using two identifiers.  Location: Patient: home Provider: home   I discussed the limitations of evaluation and management by telemedicine and the availability of in person appointments. The patient expressed understanding and agreed to proceed. [name], CMA was able to get the patient set up on a phone visit, after being unable to set up a video visit.   Subjective:    Patient ID: Joseph Mccullough, male    DOB: 1991-09-10, 29 y.o.   MRN: [id number]  No chief complaint on file.   HPI Patient is in today for evaluation of need for sleep study. During his work up with his dentist he had xrays done and they interpreted his nasal passages as narrow and suggested we consider setting him up for a sleep study. He has no symptoms such as snoring, frequent wakenings, am headaches etc no recent febrile illness or hospitalizations. Denies CP/palp/SOB/HA/congestion/fevers/GI or GU c/o. Taking meds as prescribed  Past Medical History:  Diagnosis Date  . Acid reflux 08/20/2018  . Allergy   . Anxiety and depression 08/19/2018  . Hypertension     No past surgical history on file.  Family History  Problem Relation Age of Onset  . Hypertension Father     Social History   Socioeconomic History  . Marital status: Single    Spouse name: Not on file  . Number of children: Not on file  . Years of education: Not on file  . Highest education level: Not on file  Occupational History  . Not on file  Tobacco Use  . Smoking status: Never Smoker  . Smokeless tobacco: Never Used  Substance and Sexual Activity  . Alcohol use: No  . Drug use: No  . Sexual activity: Not on file  Other Topics Concern  . Not on file  Social History Narrative  . Not on file   Social Determinants of Health   Financial  Resource Strain:   . Difficulty of Paying Living Expenses: Not on file  Food Insecurity:   . Worried About Charity fundraiser in the Last Year: Not on file  . Ran Out of Food in the Last Year: Not on file  Transportation Needs:   . Lack of Transportation (Medical): Not on file  . Lack of Transportation (Non-Medical): Not on file  Physical Activity:   . Days of Exercise per Week: Not on file  . Minutes of Exercise per Session: Not on file  Stress:   . Feeling of Stress : Not on file  Social Connections:   . Frequency of Communication with Friends and Family: Not on file  . Frequency of Social Gatherings with Friends and Family: Not on file  . Attends Religious Services: Not on file  . Active Member of Clubs or Organizations: Not on file  . Attends Archivist Meetings: Not on file  . Marital Status: Not on file  Intimate Partner Violence:   . Fear of Current or Ex-Partner: Not on file  . Emotionally Abused: Not on file  . Physically Abused: Not on file  . Sexually Abused: Not on file    Outpatient Medications Prior to Visit  Medication Sig Dispense Refill  . ALPRAZolam (XANAX) 0.25 MG tablet Take 1 tablet (0.25 mg total) by mouth 2 (two) times daily as  needed for anxiety or sleep. 30 tablet 1  . famotidine (PEPCID) 20 MG tablet Take 1 tablet (20 mg total) by mouth at bedtime. 30 tablet 5  . fluticasone (FLONASE) 50 MCG/ACT nasal spray Place 2 sprays into both nostrils daily. 16 g 11  . gabapentin (NEURONTIN) 100 MG capsule TAKE 1 CAPSULE(100 MG) BY MOUTH THREE TIMES DAILY 90 capsule 1  . lisinopril (ZESTRIL) 10 MG tablet TAKE 1 TABLET BY MOUTH EVERY DAY 90 tablet 1  . propranolol (INDERAL) 20 MG tablet TAKE 1 TABLET BY MOUTH DAILY 90 tablet 1  . sertraline (ZOLOFT) 50 MG tablet TAKE 1 TABLET BY MOUTH EVERY DAY 90 tablet 1   No facility-administered medications prior to visit.    No Known Allergies  Review of Systems  Constitutional: Negative for fever and  malaise/fatigue.  HENT: Negative for congestion.   Eyes: Negative for blurred vision.  Respiratory: Negative for shortness of breath.   Cardiovascular: Negative for chest pain, palpitations and leg swelling.  Gastrointestinal: Negative for abdominal pain, blood in stool and nausea.  Genitourinary: Negative for dysuria and frequency.  Musculoskeletal: Negative for falls.  Skin: Negative for rash.  Neurological: Negative for dizziness, loss of consciousness and headaches.  Endo/Heme/Allergies: Negative for environmental allergies.  Psychiatric/Behavioral: Negative for depression. The patient is not nervous/anxious.        Objective:    Physical Exam unable to obtain via phone  Wt 210 lb (95.3 kg)   BMI 26.96 kg/m  Wt Readings from Last 3 Encounters:  12/27/19 210 lb (95.3 kg)  02/06/19 200 lb (90.7 kg)  10/11/18 191 lb (86.6 kg)    Diabetic Foot Exam - Simple   No data filed     No results found for: WBC, HGB, HCT, PLT, GLUCOSE, CHOL, TRIG, HDL, LDLDIRECT, LDLCALC, ALT, AST, NA, K, CL, CREATININE, BUN, CO2, TSH, PSA, INR, GLUF, HGBA1C, MICROALBUR  No results found for: TSH No results found for: WBC, HGB, HCT, MCV, PLT No results found for: NA, K, CHLORIDE, CO2, GLUCOSE, BUN, CREATININE, BILITOT, ALKPHOS, AST, ALT, PROT, ALBUMIN, CALCIUM, ANIONGAP, EGFR, GFR No results found for: CHOL No results found for: HDL No results found for: LDLCALC No results found for: TRIG No results found for: CHOLHDL No results found for: HGBA1C     Assessment & Plan:   Problem List Items Addressed This Visit    Hypertension    Monitor and report anyy concerns, no changes to meds. Encouraged heart healthy diet such as the DASH diet and exercise as tolerated.       Disorder of adenoid    He had the lesion removed and is doing well      TMJ (dislocation of temporomandibular joint)    During his work up with his dentist he had xrays done and they interpreted his nasal passages as narrow  and suggested we consider setting him up for a sleep study. He has no symptoms such as snoring, frequent wakenings, am headaches etc so after long discussion will refrain from study for now but if he develops any symptoms we will reconsider.       Educated about COVID-19 virus infection    Omron Blood Pressure cuff, upper arm, want BP 100-140/60-90 Pulse oximeter, want oxygen in 90s  Weekly vitals  Take Multivitamin with minerals, selenium Vitamin D 1000-2000 IU daily Probiotic with lactobacillus and bifidophilus Asprin EC 81 mg daily  Melatonin 2-5 mg at bedtime  [email or web address]  I am having [name] maintain his fluticasone, famotidine, ALPRAZolam, gabapentin, sertraline, propranolol, and lisinopril.  No orders of the defined types were placed in this encounter.    I discussed the assessment and treatment plan with the patient. The patient was provided an opportunity to ask questions and all were answered. The patient agreed with the plan and demonstrated an understanding of the instructions.   The patient was advised to call back or seek an in-person evaluation if the symptoms worsen or if the condition fails to improve as anticipated.  I provided 25 minutes of non-face-to-face time during this encounter.   Penni Homans, MD

## 2020-04-13 DIAGNOSIS — R519 Headache, unspecified: Secondary | ICD-10-CM | POA: Diagnosis not present

## 2020-04-13 DIAGNOSIS — M779 Enthesopathy, unspecified: Secondary | ICD-10-CM | POA: Diagnosis not present

## 2020-04-13 DIAGNOSIS — M7911 Myalgia of mastication muscle: Secondary | ICD-10-CM | POA: Diagnosis not present

## 2020-04-13 DIAGNOSIS — M659 Synovitis and tenosynovitis, unspecified: Secondary | ICD-10-CM | POA: Diagnosis not present

## 2020-05-12 IMAGING — CT CT MAXILLOFACIAL W/O CM
3 of 6 series · 15 of 47 positions shown, 18 images · IV contrast (iopamidol)
Comparison: None.

CLINICAL DATA: Temporomandibular joint pain, ear crackling. LEFT
facial swelling, adenoid lesion.

EXAM:
CT NECK WITH CONTRAST; CT MAXILLOFACIAL WITHOUT CONTRAST
TECHNIQUE: Multidetector CT imaging of the maxillofacial structures was
performed. Multiplanar CT image reconstructions were also generated.
Multidetector CT imaging of the neck was performed using the
standard protocol following the bolus administration of intravenous
contrast.
CONTRAST:  75mL K6GSI2-MNN IOPAMIDOL (K6GSI2-MNN) INJECTION 61%

[Series 2: maxillofacial 2.00 hr60 s3 · axial · 0.32mm/px · z∈[-548,-396]mm · 10 of 90 slices shown, 13 images]
[im 7/90  brain]
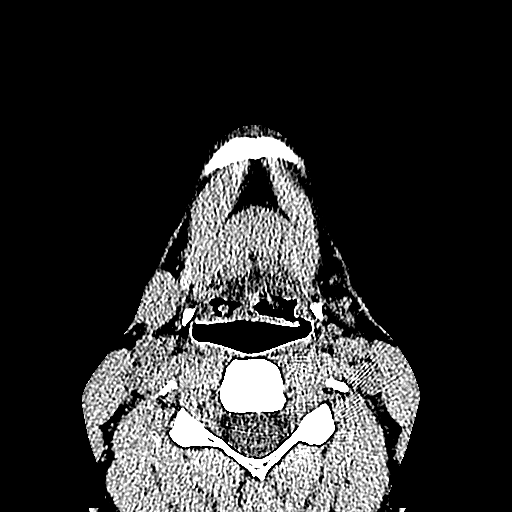
[im 7/90  bone]
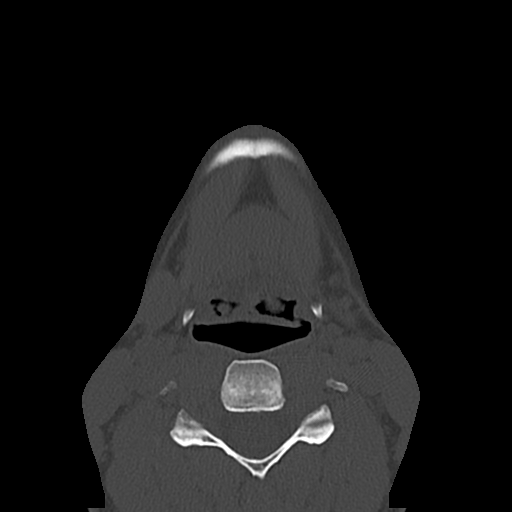
[im 13/90  bone]
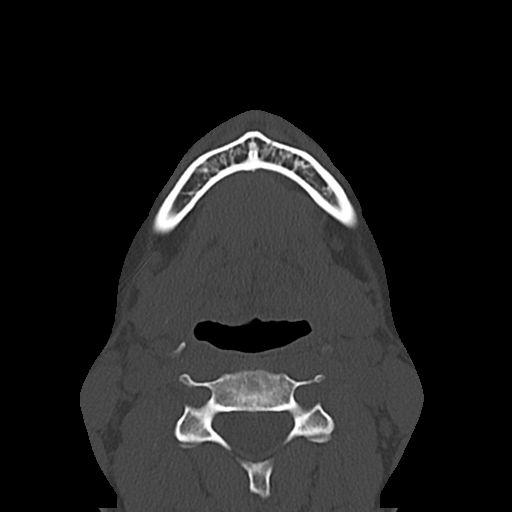
[im 26/90  bone]
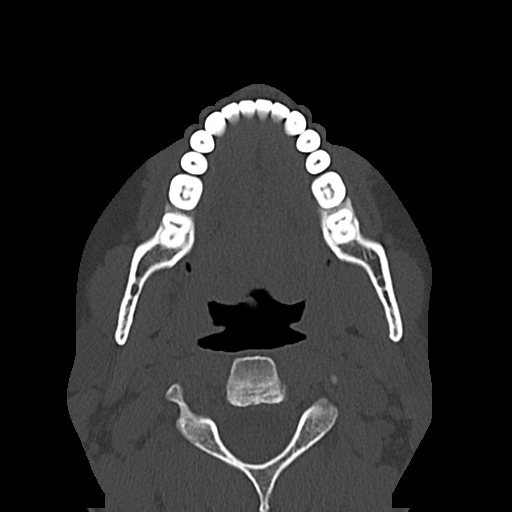
[im 32/90  bone]
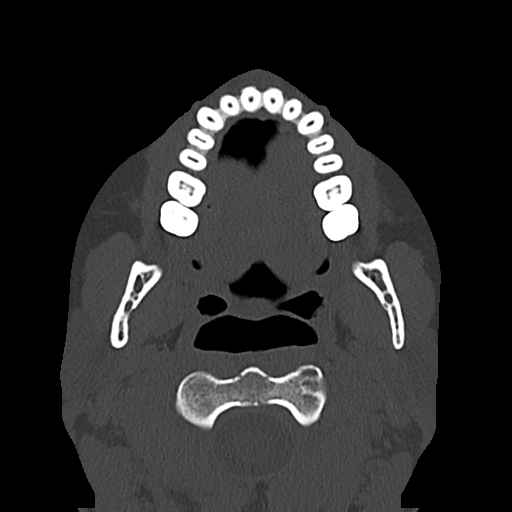
[im 39/90  brain]
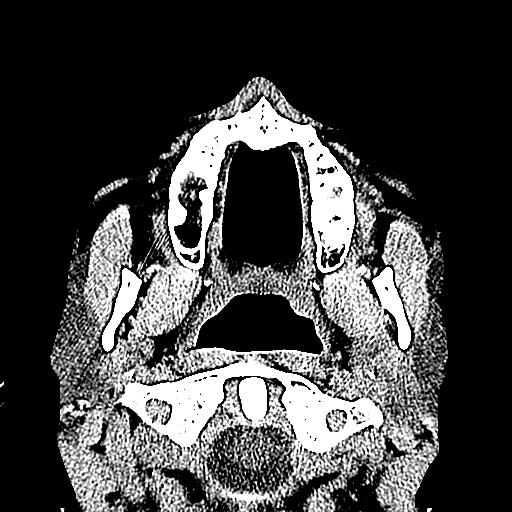
[im 39/90  bone]
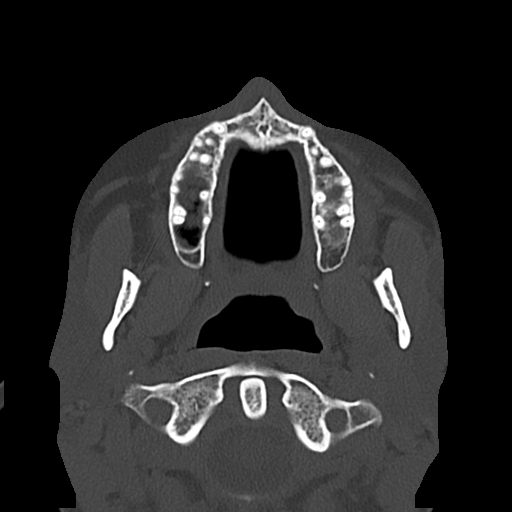
[im 51/90  bone]
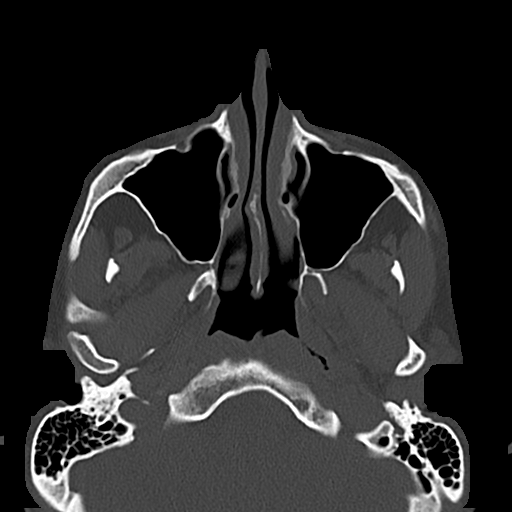
[im 58/90  bone]
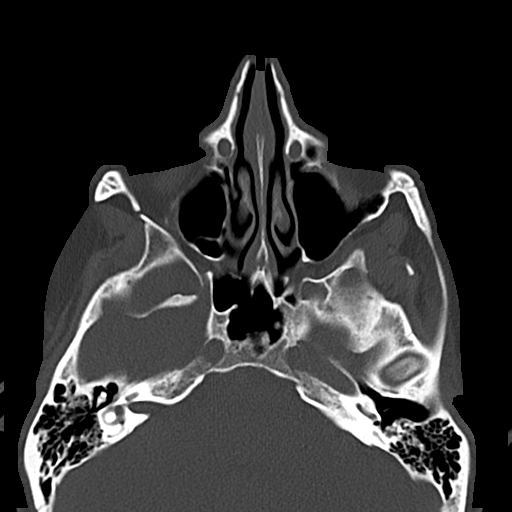
[im 64/90  bone]
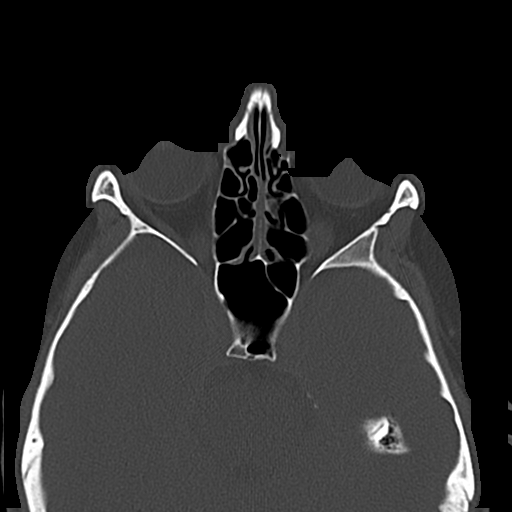
[im 77/90  brain]
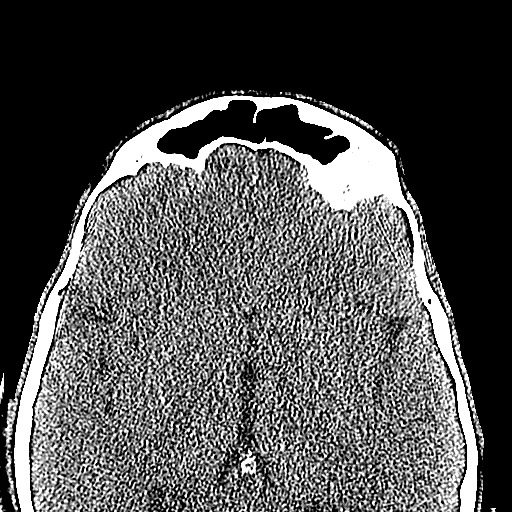
[im 77/90  bone]
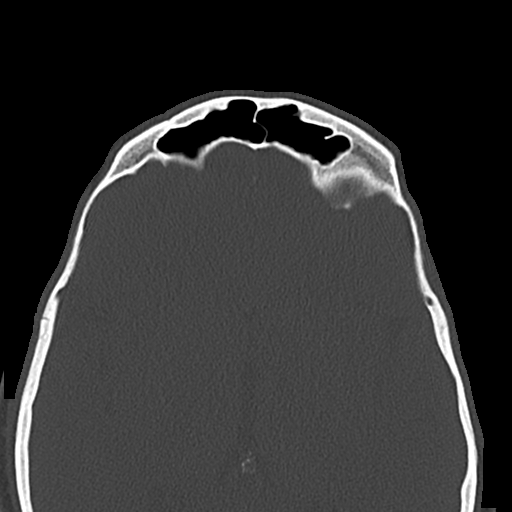
[im 83/90  bone]
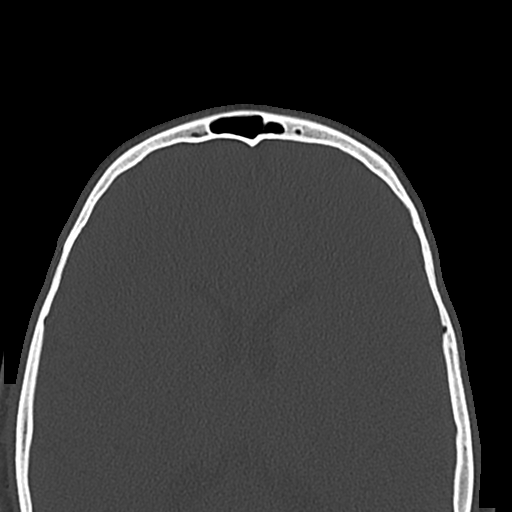

[Series 6: maxillofacial 2.00 hr60 s3 cor · coronal · 0.30mm/px · 3 of 87 slices shown]
[im 22/87  bone]
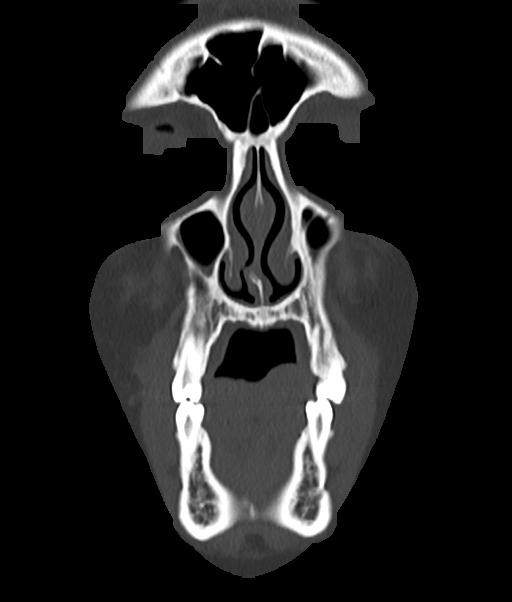
[im 44/87  bone]
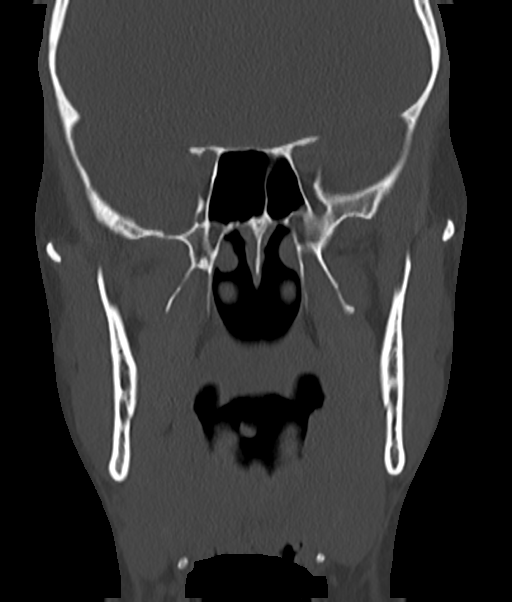
[im 65/87  bone]
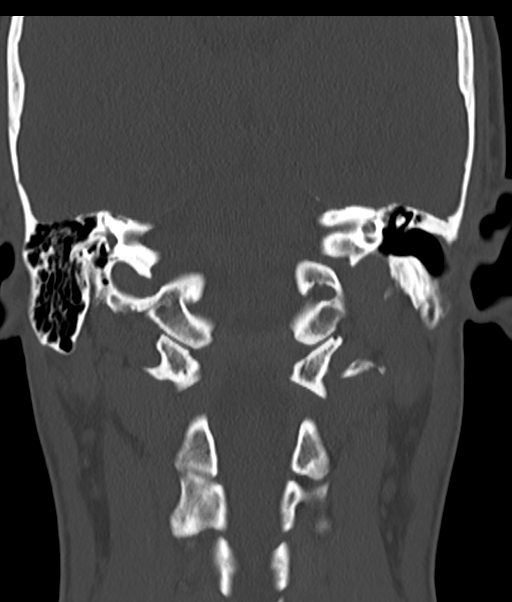

[Series 12: maxillofacial 2.00 hr40 s3 sag · sagittal · 0.34mm/px · 2 of 71 slices shown]
[im 24/71  bone]
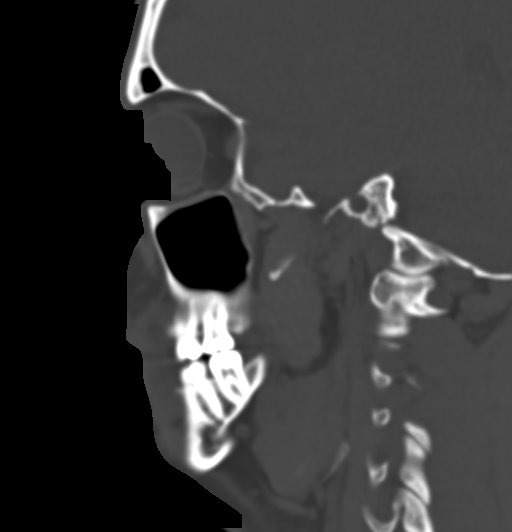
[im 47/71  bone]
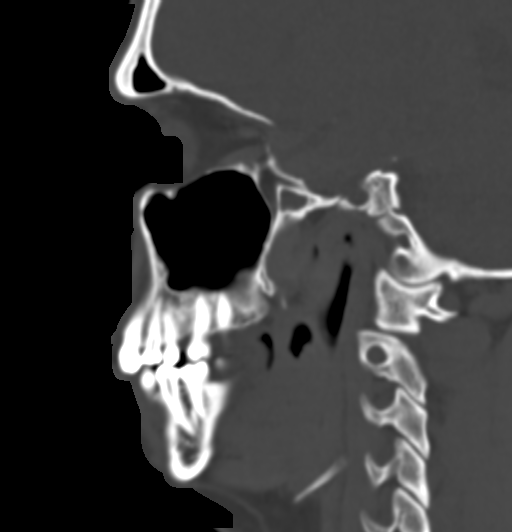

[15 of 47 positions shown; findings below may reference images not displayed]

FINDINGS: CT neck:

PHARYNX AND LARYNX: Normal.  Widely patent airway.

SALIVARY GLANDS: Normal.

THYROID: Normal.

LYMPH NODES: No lymphadenopathy by CT size criteria.

VASCULAR: Normal.

SKELETON: Nonacute.  Old nonunited C7 spinous process fracture.

UPPER CHEST: Lung apices are clear. No superior mediastinal
lymphadenopathy.

OTHER: None.

CT maxillofacial:

OSSEOUS: No acute facial fracture. The mandible is intact, the
condyles are located. No destructive bony lesions.

ORBITS: Ocular globes and orbital contents are normal.

SINUSES: Mild mucosal thickening without air-fluid levels. Intact
nasal septum is slightly deviated to the RIGHT with tiny bony spur.
Mastoid aircells are well aerated.

SOFT TISSUES: No significant soft tissue swelling. No subcutaneous
gas or radiopaque foreign bodies.

LIMITED INTRACRANIAL: Normal.
IMPRESSION: 1. Negative CT neck with contrast.
2. Negative CT maxillofacial without contrast.

## 2020-05-13 DIAGNOSIS — R531 Weakness: Secondary | ICD-10-CM | POA: Diagnosis not present

## 2020-05-13 DIAGNOSIS — M6240 Contracture of muscle, unspecified site: Secondary | ICD-10-CM | POA: Diagnosis not present

## 2020-05-13 DIAGNOSIS — R6884 Jaw pain: Secondary | ICD-10-CM | POA: Diagnosis not present

## 2020-05-13 DIAGNOSIS — M542 Cervicalgia: Secondary | ICD-10-CM | POA: Diagnosis not present

## 2020-06-10 DIAGNOSIS — G501 Atypical facial pain: Secondary | ICD-10-CM | POA: Diagnosis not present

## 2020-06-10 DIAGNOSIS — M7911 Myalgia of mastication muscle: Secondary | ICD-10-CM | POA: Diagnosis not present

## 2020-06-10 DIAGNOSIS — M7912 Myalgia of auxiliary muscles, head and neck: Secondary | ICD-10-CM | POA: Diagnosis not present

## 2020-06-10 DIAGNOSIS — R519 Headache, unspecified: Secondary | ICD-10-CM | POA: Diagnosis not present

## 2020-06-18 ENCOUNTER — Other Ambulatory Visit: Payer: Self-pay | Admitting: Family Medicine

## 2020-09-30 DIAGNOSIS — D2261 Melanocytic nevi of right upper limb, including shoulder: Secondary | ICD-10-CM | POA: Diagnosis not present

## 2020-09-30 DIAGNOSIS — D2272 Melanocytic nevi of left lower limb, including hip: Secondary | ICD-10-CM | POA: Diagnosis not present

## 2020-09-30 DIAGNOSIS — D2262 Melanocytic nevi of left upper limb, including shoulder: Secondary | ICD-10-CM | POA: Diagnosis not present

## 2020-09-30 DIAGNOSIS — D225 Melanocytic nevi of trunk: Secondary | ICD-10-CM | POA: Diagnosis not present

## 2020-11-09 DIAGNOSIS — H6991 Unspecified Eustachian tube disorder, right ear: Secondary | ICD-10-CM | POA: Diagnosis not present

## 2020-11-09 DIAGNOSIS — H9190 Unspecified hearing loss, unspecified ear: Secondary | ICD-10-CM | POA: Diagnosis not present

## 2020-12-15 ENCOUNTER — Other Ambulatory Visit: Payer: Self-pay | Admitting: Family Medicine

## 2020-12-18 ENCOUNTER — Encounter: Payer: Self-pay | Admitting: Family Medicine

## 2020-12-29 ENCOUNTER — Encounter: Payer: Self-pay | Admitting: Family Medicine

## 2021-01-04 MED ORDER — PROPRANOLOL HCL 20 MG PO TABS: 20.0000 mg | ORAL_TABLET | Freq: Every day | ORAL | 1 refills | Status: DC

## 2021-01-04 MED ORDER — LISINOPRIL 10 MG PO TABS: 10.0000 mg | ORAL_TABLET | Freq: Every day | ORAL | 1 refills | Status: DC

## 2021-01-04 MED ORDER — SERTRALINE HCL 50 MG PO TABS: 50.0000 mg | ORAL_TABLET | Freq: Every day | ORAL | 1 refills | Status: DC

## 2021-01-21 ENCOUNTER — Other Ambulatory Visit: Payer: Self-pay | Admitting: Family Medicine

## 2021-04-19 ENCOUNTER — Ambulatory Visit: Payer: BC Managed Care – PPO | Admitting: Family Medicine

## 2021-04-19 ENCOUNTER — Other Ambulatory Visit: Payer: Self-pay

## 2021-04-19 VITALS — BP 120/76 | HR 64 | Temp 98.4°F | Resp 16 | Ht 74.0 in | Wt 213.2 lb

## 2021-04-19 DIAGNOSIS — K449 Diaphragmatic hernia without obstruction or gangrene: Secondary | ICD-10-CM

## 2021-04-19 DIAGNOSIS — Z1159 Encounter for screening for other viral diseases: Secondary | ICD-10-CM | POA: Diagnosis not present

## 2021-04-19 DIAGNOSIS — S0300XD Dislocation of jaw, unspecified side, subsequent encounter: Secondary | ICD-10-CM | POA: Diagnosis not present

## 2021-04-19 DIAGNOSIS — F32A Depression, unspecified: Secondary | ICD-10-CM

## 2021-04-19 DIAGNOSIS — Z23 Encounter for immunization: Secondary | ICD-10-CM

## 2021-04-19 DIAGNOSIS — I10 Essential (primary) hypertension: Secondary | ICD-10-CM

## 2021-04-19 DIAGNOSIS — F419 Anxiety disorder, unspecified: Secondary | ICD-10-CM

## 2021-04-19 DIAGNOSIS — K219 Gastro-esophageal reflux disease without esophagitis: Secondary | ICD-10-CM | POA: Diagnosis not present

## 2021-04-19 DIAGNOSIS — H6983 Other specified disorders of Eustachian tube, bilateral: Secondary | ICD-10-CM

## 2021-04-19 MED ORDER — TIZANIDINE HCL 2 MG PO TABS: 1.0000 mg | ORAL_TABLET | Freq: Every evening | ORAL | 1 refills | Status: DC | PRN

## 2021-04-19 MED ORDER — SERTRALINE HCL 50 MG PO TABS: 25.0000 mg | ORAL_TABLET | Freq: Every day | ORAL | 1 refills | Status: DC

## 2021-04-19 MED ORDER — LISINOPRIL 10 MG PO TABS: 10.0000 mg | ORAL_TABLET | Freq: Every day | ORAL | 1 refills | Status: DC

## 2021-04-19 MED ORDER — PROPRANOLOL HCL 20 MG PO TABS: 20.0000 mg | ORAL_TABLET | Freq: Every day | ORAL | 1 refills | Status: DC

## 2021-04-19 NOTE — Assessment & Plan Note (Signed)
Ears look good bilaterally

## 2021-04-19 NOTE — Patient Instructions (Signed)

## 2021-04-19 NOTE — Assessment & Plan Note (Signed)
Doing well off of meds. Avoid offending foods, start probiotics. Do not eat large meals in late evening and consider raising head of bed.

## 2021-04-19 NOTE — Assessment & Plan Note (Addendum)
Doing well off of Alprazolam and is interested in titrating off of the Sertraline. Can drop to 25 mg daily and then stop.

## 2021-04-19 NOTE — Assessment & Plan Note (Signed)
Well controlled, no changes to meds. Encouraged heart healthy diet such as the DASH diet and exercise as tolerated.  °

## 2021-04-19 NOTE — Progress Notes (Signed)
Subjective:    Patient ID: Joseph Mccullough, male    DOB: 1991-05-18, 30 y.o.   MRN: [id number]  Chief Complaint  Patient presents with   Follow-up   Hypertension    HPI Patient is in today for follow up on chronic medical concerns. No recent febrile illness or hospitalization. He continues to enjoy his job in the [place]. His reflux has not been bothering him and he is having no acute concerns. He feels he is ready to come off of Sertraline. Denies CP/palp/SOB/HA/congestion/fevers/GI or GU c/o. Taking meds as prescribed   Past Medical History:  Diagnosis Date   Acid reflux 08/20/2018   Allergy    Anxiety and depression 08/19/2018   Hypertension     No past surgical history on file.  Family History  Problem Relation Age of Onset   Hypertension Father     Social History   Socioeconomic History   Marital status: Single    Spouse name: Not on file   Number of children: Not on file   Years of education: Not on file   Highest education level: Not on file  Occupational History   Not on file  Tobacco Use   Smoking status: Never   Smokeless tobacco: Never  Substance and Sexual Activity   Alcohol use: No   Drug use: No   Sexual activity: Not on file  Other Topics Concern   Not on file  Social History Narrative   Not on file   Social Determinants of Health   Financial Resource Strain: Not on file  Food Insecurity: Not on file  Transportation Needs: Not on file  Physical Activity: Not on file  Stress: Not on file  Social Connections: Not on file  Intimate Partner Violence: Not on file    Outpatient Medications Prior to Visit  Medication Sig Dispense Refill   fluticasone (FLONASE) 50 MCG/ACT nasal spray Place 2 sprays into both nostrils daily. 16 g 11   ALPRAZolam (XANAX) 0.25 MG tablet Take 1 tablet (0.25 mg total) by mouth 2 (two) times daily as needed for anxiety or sleep. 30 tablet 1   famotidine (PEPCID) 20 MG tablet Take 1 tablet (20 mg total) by mouth at  bedtime. 30 tablet 5   gabapentin (NEURONTIN) 100 MG capsule TAKE 1 CAPSULE(100 MG) BY MOUTH THREE TIMES DAILY 90 capsule 1   lisinopril (ZESTRIL) 10 MG tablet Take 1 tablet (10 mg total) by mouth daily. 90 tablet 1   propranolol (INDERAL) 20 MG tablet Take 1 tablet (20 mg total) by mouth daily. 90 tablet 1   sertraline (ZOLOFT) 50 MG tablet Take 1 tablet (50 mg total) by mouth daily. 90 tablet 1   No facility-administered medications prior to visit.    No Known Allergies  Review of Systems  Constitutional:  Negative for fever and malaise/fatigue.  HENT:  Negative for congestion.   Eyes:  Negative for blurred vision.  Respiratory:  Negative for shortness of breath.   Cardiovascular:  Negative for chest pain, palpitations and leg swelling.  Gastrointestinal:  Negative for abdominal pain, blood in stool and nausea.  Genitourinary:  Negative for dysuria and frequency.  Musculoskeletal:  Negative for falls.  Skin:  Negative for rash.  Neurological:  Negative for dizziness, loss of consciousness and headaches.  Endo/Heme/Allergies:  Negative for environmental allergies.  Psychiatric/Behavioral:  Negative for depression. The patient is not nervous/anxious.       Objective:    Physical Exam Constitutional:  General: He is not in acute distress.    Appearance: Normal appearance. He is not ill-appearing or toxic-appearing.  HENT:     Head: Normocephalic and atraumatic.     Right Ear: External ear normal.     Left Ear: External ear normal.     Nose: Nose normal.  Eyes:     General:        Right eye: No discharge.        Left eye: No discharge.  Cardiovascular:     Rate and Rhythm: Normal rate and regular rhythm.  Pulmonary:     Effort: Pulmonary effort is normal.     Breath sounds: Normal breath sounds.  Musculoskeletal:     Cervical back: Normal range of motion.  Skin:    Findings: No rash.  Neurological:     Mental Status: He is alert and oriented to person, place, and  time.  Psychiatric:        Behavior: Behavior normal.    BP 120/76   Pulse 64   Temp 98.4 F (36.9 C)   Resp 16   Ht 6' 2"  (1.88 m)   Wt 213 lb 3.2 oz (96.7 kg)   SpO2 99%   BMI 27.37 kg/m  Wt Readings from Last 3 Encounters:  04/19/21 213 lb 3.2 oz (96.7 kg)  12/27/19 210 lb (95.3 kg)  02/06/19 200 lb (90.7 kg)    Diabetic Foot Exam - Simple   No data filed    No results found for: WBC, HGB, HCT, PLT, GLUCOSE, CHOL, TRIG, HDL, LDLDIRECT, LDLCALC, ALT, AST, NA, K, CL, CREATININE, BUN, CO2, TSH, PSA, INR, GLUF, HGBA1C, MICROALBUR  No results found for: TSH No results found for: WBC, HGB, HCT, MCV, PLT No results found for: NA, K, CHLORIDE, CO2, GLUCOSE, BUN, CREATININE, BILITOT, ALKPHOS, AST, ALT, PROT, ALBUMIN, CALCIUM, ANIONGAP, EGFR, GFR No results found for: CHOL No results found for: HDL No results found for: LDLCALC No results found for: TRIG No results found for: CHOLHDL No results found for: HGBA1C     Assessment & Plan:   Problem List Items Addressed This Visit     Anxiety and depression    Doing well off of Alprazolam and is interested in titrating off of the Sertraline. Can drop to 25 mg daily and then stop.       Relevant Medications   sertraline (ZOLOFT) 50 MG tablet   Hypertension    Well controlled, no changes to meds. Encouraged heart healthy diet such as the DASH diet and exercise as tolerated.        Relevant Medications   lisinopril (ZESTRIL) 10 MG tablet   propranolol (INDERAL) 20 MG tablet   Other Relevant Orders   CBC   Comprehensive metabolic panel   Lipid panel   TSH   Hiatal hernia with GERD    Doing well off of meds. Avoid offending foods, start probiotics. Do not eat large meals in late evening and consider raising head of bed.        ETD (eustachian tube dysfunction)    Ears look good bilaterally       TMJ (dislocation of temporomandibular joint)    R>L has seen an oral surgeon and she was given a night guard. He is  encouraged to add Lidocaine gel or biotfreeze to the area. Will add Tizanidine 1-4 mg qhs prn       Relevant Orders   CBC   Comprehensive metabolic panel   Other Visit Diagnoses  Encounter for hepatitis C screening test for low risk patient    -  Primary   Relevant Orders   Hepatitis C Antibody   Need for Tdap vaccination       Relevant Orders   Tdap vaccine greater than or equal to 7yo IM (Completed)       I have discontinued Derreon S. Koziol's famotidine, ALPRAZolam, and gabapentin. I have also changed his sertraline. Additionally, I am having him start on tiZANidine. Lastly, I am having him maintain his fluticasone, lisinopril, and propranolol.  Meds ordered this encounter  Medications   tiZANidine (ZANAFLEX) 2 MG tablet    Sig: Take 0.5-2 tablets (1-4 mg total) by mouth at bedtime as needed for muscle spasms.    Dispense:  30 tablet    Refill:  1   sertraline (ZOLOFT) 50 MG tablet    Sig: Take 0.5 tablets (25 mg total) by mouth daily.    Dispense:  90 tablet    Refill:  1   lisinopril (ZESTRIL) 10 MG tablet    Sig: Take 1 tablet (10 mg total) by mouth daily.    Dispense:  90 tablet    Refill:  1   propranolol (INDERAL) 20 MG tablet    Sig: Take 1 tablet (20 mg total) by mouth daily.    Dispense:  90 tablet    Refill:  1     Penni Homans, MD -

## 2021-04-19 NOTE — Assessment & Plan Note (Signed)
R>L has seen an oral surgeon and she was given a night guard. He is encouraged to add Lidocaine gel or biotfreeze to the area. Will add Tizanidine 1-4 mg qhs prn

## 2021-04-20 LAB — LIPID PANEL
Cholesterol: 156 mg/dL (ref 0–200)
HDL: 53.1 mg/dL (ref >39.00)
LDL Cholesterol: 74 mg/dL (ref 0–99)
NonHDL: 102.98
Total CHOL/HDL Ratio: 3
Triglycerides: 144 mg/dL (ref 0.0–149.0)
VLDL: 28.8 mg/dL (ref 0.0–40.0)

## 2021-04-20 LAB — COMPREHENSIVE METABOLIC PANEL
ALT: 69 U/L — ABNORMAL HIGH (ref 0–53)
AST: 45 U/L — ABNORMAL HIGH (ref 0–37)
Albumin: 4.5 g/dL (ref 3.5–5.2)
Alkaline Phosphatase: 85 U/L (ref 39–117)
BUN: 15 mg/dL (ref 6–23)
CO2: 28 mEq/L (ref 19–32)
Calcium: 10 mg/dL (ref 8.4–10.5)
Chloride: 102 mEq/L (ref 96–112)
Creatinine, Ser: 1.15 mg/dL (ref 0.40–1.50)
GFR: 84.39 mL/min (ref >60.00)
Glucose, Bld: 88 mg/dL (ref 70–99)
Potassium: 5 mEq/L (ref 3.5–5.1)
Sodium: 137 mEq/L (ref 135–145)
Total Bilirubin: 0.5 mg/dL (ref 0.2–1.2)
Total Protein: 7.1 g/dL (ref 6.0–8.3)

## 2021-04-20 LAB — TSH: TSH: 1.17 u[IU]/mL (ref 0.35–4.50)

## 2021-04-20 LAB — CBC
HCT: 45.6 % (ref 39.0–52.0)
Hemoglobin: 15.6 g/dL (ref 13.0–17.0)
MCHC: 34.3 g/dL (ref 30.0–36.0)
MCV: 88.4 fl (ref 78.0–100.0)
Platelets: 303 10*3/uL (ref 150.0–400.0)
RBC: 5.16 Mil/uL (ref 4.22–5.81)
RDW: 13.2 % (ref 11.5–15.5)
WBC: 7.7 10*3/uL (ref 4.0–10.5)

## 2021-04-20 LAB — HEPATITIS C ANTIBODY
Hepatitis C Ab: NONREACTIVE
SIGNAL TO CUT-OFF: 0.03 (ref <1.00)

## 2021-08-30 ENCOUNTER — Encounter: Payer: Self-pay | Admitting: Family Medicine

## 2021-08-31 DIAGNOSIS — Z03818 Encounter for observation for suspected exposure to other biological agents ruled out: Secondary | ICD-10-CM | POA: Diagnosis not present

## 2021-08-31 DIAGNOSIS — J029 Acute pharyngitis, unspecified: Secondary | ICD-10-CM | POA: Diagnosis not present

## 2021-08-31 DIAGNOSIS — J018 Other acute sinusitis: Secondary | ICD-10-CM | POA: Diagnosis not present

## 2021-08-31 DIAGNOSIS — R059 Cough, unspecified: Secondary | ICD-10-CM | POA: Diagnosis not present

## 2021-08-31 DIAGNOSIS — Z20822 Contact with and (suspected) exposure to covid-19: Secondary | ICD-10-CM | POA: Diagnosis not present

## 2021-09-03 ENCOUNTER — Other Ambulatory Visit: Payer: Self-pay

## 2021-09-03 ENCOUNTER — Other Ambulatory Visit: Payer: Self-pay | Admitting: Family Medicine

## 2021-09-03 MED ORDER — METHYLPREDNISOLONE 4 MG PO TABS: ORAL_TABLET | ORAL | 0 refills | Status: DC

## 2021-11-23 ENCOUNTER — Encounter: Payer: Self-pay | Admitting: Family Medicine

## 2021-11-23 ENCOUNTER — Ambulatory Visit (INDEPENDENT_AMBULATORY_CARE_PROVIDER_SITE_OTHER): Payer: BC Managed Care – PPO | Admitting: Family Medicine

## 2021-11-23 VITALS — BP 124/70 | HR 63 | Temp 97.9°F | Resp 16 | Ht 75.0 in | Wt 208.6 lb

## 2021-11-23 DIAGNOSIS — F419 Anxiety disorder, unspecified: Secondary | ICD-10-CM | POA: Diagnosis not present

## 2021-11-23 DIAGNOSIS — K449 Diaphragmatic hernia without obstruction or gangrene: Secondary | ICD-10-CM

## 2021-11-23 DIAGNOSIS — Z0000 Encounter for general adult medical examination without abnormal findings: Secondary | ICD-10-CM | POA: Diagnosis not present

## 2021-11-23 DIAGNOSIS — I10 Essential (primary) hypertension: Secondary | ICD-10-CM

## 2021-11-23 DIAGNOSIS — H6983 Other specified disorders of Eustachian tube, bilateral: Secondary | ICD-10-CM | POA: Diagnosis not present

## 2021-11-23 DIAGNOSIS — K219 Gastro-esophageal reflux disease without esophagitis: Secondary | ICD-10-CM

## 2021-11-23 DIAGNOSIS — F32A Depression, unspecified: Secondary | ICD-10-CM

## 2021-11-23 LAB — CBC WITH DIFFERENTIAL/PLATELET
Basophils Absolute: 0 10*3/uL (ref 0.0–0.1)
Basophils Relative: 0.7 % (ref 0.0–3.0)
Eosinophils Absolute: 0.1 10*3/uL (ref 0.0–0.7)
Eosinophils Relative: 1.7 % (ref 0.0–5.0)
HCT: 46.3 % (ref 39.0–52.0)
Hemoglobin: 15.4 g/dL (ref 13.0–17.0)
Lymphocytes Relative: 26.8 % (ref 12.0–46.0)
Lymphs Abs: 1.8 10*3/uL (ref 0.7–4.0)
MCHC: 33.3 g/dL (ref 30.0–36.0)
MCV: 89.4 fl (ref 78.0–100.0)
Monocytes Absolute: 0.6 10*3/uL (ref 0.1–1.0)
Monocytes Relative: 9.1 % (ref 3.0–12.0)
Neutro Abs: 4.2 10*3/uL (ref 1.4–7.7)
Neutrophils Relative %: 61.7 % (ref 43.0–77.0)
Platelets: 282 10*3/uL (ref 150.0–400.0)
RBC: 5.18 Mil/uL (ref 4.22–5.81)
RDW: 13.9 % (ref 11.5–15.5)
WBC: 6.8 10*3/uL (ref 4.0–10.5)

## 2021-11-23 LAB — LIPID PANEL
Cholesterol: 140 mg/dL (ref 0–200)
HDL: 54.2 mg/dL (ref >39.00)
LDL Cholesterol: 73 mg/dL (ref 0–99)
NonHDL: 86.29
Total CHOL/HDL Ratio: 3
Triglycerides: 66 mg/dL (ref 0.0–149.0)
VLDL: 13.2 mg/dL (ref 0.0–40.0)

## 2021-11-23 LAB — COMPREHENSIVE METABOLIC PANEL
ALT: 24 U/L (ref 0–53)
AST: 23 U/L (ref 0–37)
Albumin: 4.7 g/dL (ref 3.5–5.2)
Alkaline Phosphatase: 75 U/L (ref 39–117)
BUN: 16 mg/dL (ref 6–23)
CO2: 29 mEq/L (ref 19–32)
Calcium: 10 mg/dL (ref 8.4–10.5)
Chloride: 101 mEq/L (ref 96–112)
Creatinine, Ser: 1.2 mg/dL (ref 0.40–1.50)
GFR: 79.85 mL/min (ref >60.00)
Glucose, Bld: 90 mg/dL (ref 70–99)
Potassium: 4.3 mEq/L (ref 3.5–5.1)
Sodium: 138 mEq/L (ref 135–145)
Total Bilirubin: 1 mg/dL (ref 0.2–1.2)
Total Protein: 7.2 g/dL (ref 6.0–8.3)

## 2021-11-23 LAB — TSH: TSH: 1.33 u[IU]/mL (ref 0.35–5.50)

## 2021-11-23 NOTE — Patient Instructions (Signed)
CBD daily cream, can order at website [employer]  Preventive Care 33-33 Years Old, Male Preventive care refers to lifestyle choices and visits with your health care provider that can promote health and wellness. Preventive care visits are also called wellness exams. What can I expect for my preventive care visit? Counseling During your preventive care visit, your health care provider may ask about your: Medical history, including: Past medical problems. Family medical history. Current health, including: Emotional well-being. Home life and relationship well-being. Sexual activity. Lifestyle, including: Alcohol, nicotine or tobacco, and drug use. Access to firearms. Diet, exercise, and sleep habits. Safety issues such as seatbelt and bike helmet use. Sunscreen use. Work and work Statistician. Physical exam Your health care provider may check your: Height and weight. These may be used to calculate your BMI (body mass index). BMI is a measurement that tells if you are at a healthy weight. Waist circumference. This measures the distance around your waistline. This measurement also tells if you are at a healthy weight and may help predict your risk of certain diseases, such as type 2 diabetes and high blood pressure. Heart rate and blood pressure. Body temperature. Skin for abnormal spots. What immunizations do I need? Vaccines are usually given at various ages, according to a schedule. Your health care provider will recommend vaccines for you based on your age, medical history, and lifestyle or other factors, such as travel or where you work. What tests do I need? Screening Your health care provider may recommend screening tests for certain conditions. This may include: Lipid and cholesterol levels. Diabetes screening. This is done by checking your blood sugar (glucose) after you have not eaten for a while (fasting). Hepatitis B test. Hepatitis C test. HIV (human immunodeficiency  virus) test. STI (sexually transmitted infection) testing, if you are at risk. Talk with your health care provider about your test results, treatment options, and if necessary, the need for more tests. Follow these instructions at home: Eating and drinking  Eat a healthy diet that includes fresh fruits and vegetables, whole grains, lean protein, and low-fat dairy products. Drink enough fluid to keep your urine pale yellow. Take vitamin and mineral supplements as recommended by your health care provider. Do not drink alcohol if your health care provider tells you not to drink. If you drink alcohol: Limit how much you have to 0-2 drinks a day. Know how much alcohol is in your drink. In the [place] one drink equals one 12 oz bottle of beer (355 mL), one 5 oz glass of wine (148 mL), or one 1 oz glass of hard liquor (44 mL). Lifestyle Brush your teeth every morning and night with fluoride toothpaste. Floss one time each day. Exercise for at least 30 minutes 5 or more days each week. Do not use any products that contain nicotine or tobacco. These products include cigarettes, chewing tobacco, and vaping devices, such as e-cigarettes. If you need help quitting, ask your health care provider. Do not use drugs. If you are sexually active, practice safe sex. Use a condom or other form of protection to prevent STIs. Find healthy ways to manage stress, such as: Meditation, yoga, or listening to music. Journaling. Talking to a trusted person. Spending time with friends and family. Minimize exposure to UV radiation to reduce your risk of skin cancer. Safety Always wear your seat belt while driving or riding in a vehicle. Do not drive: If you have been drinking alcohol. Do not ride with someone who has been  drinking. If you have been using any mind-altering substances or drugs. While texting. When you are tired or distracted. Wear a helmet and other protective equipment during sports activities. If  you have firearms in your house, make sure you follow all gun safety procedures. Seek help if you have been physically or sexually abused. What's next? Go to your health care provider once a year for an annual wellness visit. Ask your health care provider how often you should have your eyes and teeth checked. Stay up to date on all vaccines. This information is not intended to replace advice given to you by your health care provider. Make sure you discuss any questions you have with your health care provider. Document Revised: 04/14/2021 Document Reviewed: 04/14/2021 Elsevier Patient Education  [employer].

## 2021-11-23 NOTE — Assessment & Plan Note (Signed)
Well controlled, no changes to meds. Encouraged heart healthy diet such as the DASH diet and exercise as tolerated.  °

## 2021-11-24 DIAGNOSIS — Z0000 Encounter for general adult medical examination without abnormal findings: Secondary | ICD-10-CM | POA: Insufficient documentation

## 2021-11-24 NOTE — Assessment & Plan Note (Signed)
Patient encouraged to maintain heart healthy diet, regular exercise, adequate sleep. Consider daily probiotics. Take medications as prescribed. Labs ordered and reviewed. Declines COVID boosters but he did take the initial J and J shot. Tdap up to date.

## 2021-11-24 NOTE — Assessment & Plan Note (Signed)
His tonsillar pillars are erythematous and with his ear symptoms and TMJ symptoms consider silent reflux.  Started on famotidine 20 mg once to twice daily.

## 2021-11-24 NOTE — Assessment & Plan Note (Signed)
He reports initially doing well after coming off his sertraline but does acknowledge his anxiety is slightly worse recently.  He reports his job is going well but he is in a new relationship and is somewhat anxious due to that.  For now we will hold off on restarting sertraline but if he chooses to restart he will notify us.

## 2021-11-24 NOTE — Progress Notes (Signed)
Subjective:    Patient ID: Joseph Mccullough, male    DOB: 04/12/1991, 31 y.o.   MRN: [id number]  Chief Complaint  Patient presents with   Annual Exam    HPI Patient is in today for annual preventative exam and follow up on chronic medical concerns. No recent febrile illness or hospitalizations. His job in [place] is going well. He is dating someone new and he wonders if that is increasing his anxiety some. He is noting an increase in left sided TMJ tension and pain, left ear pain and crepitus, pressure. No discharge or fevers. Denies CP/palp/SOB/HA/congestion/fevers/GI or GU c/o. Taking meds as prescribed. He is eating well and stays active.   Past Medical History:  Diagnosis Date   Acid reflux 08/20/2018   Allergy    Anxiety and depression 08/19/2018   Hypertension     No past surgical history on file.  Family History  Problem Relation Age of Onset   Hypertension Father     Social History   Socioeconomic History   Marital status: Single    Spouse name: Not on file   Number of children: Not on file   Years of education: Not on file   Highest education level: Not on file  Occupational History   Not on file  Tobacco Use   Smoking status: Never   Smokeless tobacco: Never  Substance and Sexual Activity   Alcohol use: No   Drug use: No   Sexual activity: Not on file  Other Topics Concern   Not on file  Social History Narrative   Not on file   Social Determinants of Health   Financial Resource Strain: Not on file  Food Insecurity: Not on file  Transportation Needs: Not on file  Physical Activity: Not on file  Stress: Not on file  Social Connections: Not on file  Intimate Partner Violence: Not on file    Outpatient Medications Prior to Visit  Medication Sig Dispense Refill   lisinopril (ZESTRIL) 10 MG tablet Take 1 tablet (10 mg total) by mouth daily. 90 tablet 1   propranolol (INDERAL) 20 MG tablet Take 1 tablet (20 mg total) by mouth daily. 90 tablet 1    tiZANidine (ZANAFLEX) 2 MG tablet Take 0.5-2 tablets (1-4 mg total) by mouth at bedtime as needed for muscle spasms. 30 tablet 1   fluticasone (FLONASE) 50 MCG/ACT nasal spray Place 2 sprays into both nostrils daily. 16 g 11   methylPREDNISolone (MEDROL) 4 MG tablet 5 tabs po x 1 day then 4 tabs po x 1 day then 3 tabs po x 1 day then 2 tabs po x 1 day then 1 tab po x 1 day and stop 15 tablet 0   sertraline (ZOLOFT) 50 MG tablet Take 0.5 tablets (25 mg total) by mouth daily. 90 tablet 1   No facility-administered medications prior to visit.    No Known Allergies  Review of Systems  Constitutional:  Negative for chills, fever and malaise/fatigue.  HENT:  Positive for ear pain. Negative for congestion, ear discharge, hearing loss and nosebleeds.   Eyes:  Negative for discharge.  Respiratory:  Negative for cough, sputum production and shortness of breath.   Cardiovascular:  Negative for chest pain, palpitations and leg swelling.  Gastrointestinal:  Negative for abdominal pain, blood in stool, constipation, diarrhea, heartburn, nausea and vomiting.  Genitourinary:  Negative for dysuria, frequency, hematuria and urgency.  Musculoskeletal:  Positive for joint pain. Negative for back pain, falls and myalgias.  Skin:  Negative for rash.  Neurological:  Negative for dizziness, sensory change, loss of consciousness, weakness and headaches.  Endo/Heme/Allergies:  Negative for environmental allergies. Does not bruise/bleed easily.  Psychiatric/Behavioral:  Negative for depression and suicidal ideas. The patient is not nervous/anxious and does not have insomnia.       Objective:    Physical Exam Constitutional:      General: He is not in acute distress.    Appearance: Normal appearance. He is not ill-appearing or toxic-appearing.  HENT:     Head: Normocephalic and atraumatic.     Right Ear: Tympanic membrane, ear canal and external ear normal. There is no impacted cerumen.     Left Ear: Tympanic  membrane, ear canal and external ear normal. There is impacted cerumen.     Nose: Nose normal.  Eyes:     General:        Right eye: No discharge.        Left eye: No discharge.  Cardiovascular:     Rate and Rhythm: Normal rate and regular rhythm.     Pulses: Normal pulses.     Heart sounds: Normal heart sounds.  Pulmonary:     Effort: Pulmonary effort is normal. No respiratory distress.     Breath sounds: No wheezing or rales.  Abdominal:     General: Bowel sounds are normal.     Palpations: Abdomen is soft. There is no mass.     Tenderness: There is no abdominal tenderness. There is no guarding.  Skin:    Findings: No rash.  Neurological:     General: No focal deficit present.     Mental Status: He is alert and oriented to person, place, and time.     Cranial Nerves: No cranial nerve deficit.  Psychiatric:        Behavior: Behavior normal.    BP 124/70    Pulse 63    Temp 97.9 F (36.6 C)    Resp 16    Ht 6\' 3"  (1.905 m)    Wt 208 lb 9.6 oz (94.6 kg)    SpO2 98%    BMI 26.07 kg/m  Wt Readings from Last 3 Encounters:  11/23/21 208 lb 9.6 oz (94.6 kg)  04/19/21 213 lb 3.2 oz (96.7 kg)  12/27/19 210 lb (95.3 kg)    Diabetic Foot Exam - Simple   No data filed    Lab Results  Component Value Date   WBC 6.8 11/23/2021   HGB 15.4 11/23/2021   HCT 46.3 11/23/2021   PLT 282.0 11/23/2021   GLUCOSE 90 11/23/2021   CHOL 140 11/23/2021   TRIG 66.0 11/23/2021   HDL 54.20 11/23/2021   LDLCALC 73 11/23/2021   ALT 24 11/23/2021   AST 23 11/23/2021   NA 138 11/23/2021   K 4.3 11/23/2021   CL 101 11/23/2021   CREATININE 1.20 11/23/2021   BUN 16 11/23/2021   CO2 29 11/23/2021   TSH 1.33 11/23/2021    Lab Results  Component Value Date   TSH 1.33 11/23/2021   Lab Results  Component Value Date   WBC 6.8 11/23/2021   HGB 15.4 11/23/2021   HCT 46.3 11/23/2021   MCV 89.4 11/23/2021   PLT 282.0 11/23/2021   Lab Results  Component Value Date   NA 138 11/23/2021   K  4.3 11/23/2021   CO2 29 11/23/2021   GLUCOSE 90 11/23/2021   BUN 16 11/23/2021   CREATININE 1.20 11/23/2021   BILITOT  1.0 11/23/2021   ALKPHOS 75 11/23/2021   AST 23 11/23/2021   ALT 24 11/23/2021   PROT 7.2 11/23/2021   ALBUMIN 4.7 11/23/2021   CALCIUM 10.0 11/23/2021   GFR 79.85 11/23/2021   Lab Results  Component Value Date   CHOL 140 11/23/2021   Lab Results  Component Value Date   HDL 54.20 11/23/2021   Lab Results  Component Value Date   LDLCALC 73 11/23/2021   Lab Results  Component Value Date   TRIG 66.0 11/23/2021   Lab Results  Component Value Date   CHOLHDL 3 11/23/2021   No results found for: HGBA1C     Assessment & Plan:   Problem List Items Addressed This Visit     Anxiety and depression    He reports initially doing well after coming off his sertraline but does acknowledge his anxiety is slightly worse recently.  He reports his job is going well but he is in a new relationship and is somewhat anxious due to that.  For now we will hold off on restarting sertraline but if he chooses to restart he will notify us.      Hypertension - Primary    Well controlled, no changes to meds. Encouraged heart healthy diet such as the DASH diet and exercise as tolerated.       Relevant Orders   CBC with Differential/Platelet (Completed)   Comprehensive metabolic panel (Completed)   Lipid panel (Completed)   TSH (Completed)   Hiatal hernia with GERD    His tonsillar pillars are erythematous and with his ear symptoms and TMJ symptoms consider silent reflux.  Started on famotidine 20 mg once to twice daily.      ETD (eustachian tube dysfunction)    With his increased anxiety and tension his left ear discomfort, TMJ and eustachian tube dysfunction have flared again.  For now he is encouraged to alternate ice and heat try topical treatments and stretching but if he gets no relief and especially if symptoms worsen he will notify us for further consideration of  referral to ear nose and throat.      Preventative health care    Patient encouraged to maintain heart healthy diet, regular exercise, adequate sleep. Consider daily probiotics. Take medications as prescribed. Labs ordered and reviewed. Declines COVID boosters but he did take the initial J and J shot. Tdap up to date.        I have discontinued Daren S. Furnas's fluticasone, sertraline, and methylPREDNISolone. I am also having him maintain his tiZANidine, lisinopril, and propranolol.  No orders of the defined types were placed in this encounter.    Penni Homans, MD

## 2021-11-24 NOTE — Assessment & Plan Note (Signed)
With his increased anxiety and tension his left ear discomfort, TMJ and eustachian tube dysfunction have flared again.  For now he is encouraged to alternate ice and heat try topical treatments and stretching but if he gets no relief and especially if symptoms worsen he will notify us for further consideration of referral to ear nose and throat.

## 2021-12-05 ENCOUNTER — Encounter: Payer: Self-pay | Admitting: Family Medicine

## 2021-12-10 ENCOUNTER — Other Ambulatory Visit: Payer: Self-pay

## 2021-12-10 ENCOUNTER — Other Ambulatory Visit (HOSPITAL_BASED_OUTPATIENT_CLINIC_OR_DEPARTMENT_OTHER): Payer: Self-pay

## 2021-12-10 MED ORDER — SERTRALINE HCL 50 MG PO TABS
ORAL_TABLET | ORAL | 2 refills | Status: DC
  Filled 2021-12-10: qty 30, 30d supply, fill #0

## 2021-12-10 MED ORDER — TIZANIDINE HCL 2 MG PO TABS
1.0000 mg | ORAL_TABLET | Freq: Every evening | ORAL | 2 refills | Status: DC | PRN
Start: 2021-12-10 — End: 2022-01-31

## 2021-12-10 MED ORDER — SERTRALINE HCL 50 MG PO TABS: ORAL_TABLET | ORAL | 2 refills | Status: DC

## 2021-12-10 MED ORDER — TIZANIDINE HCL 2 MG PO TABS
1.0000 mg | ORAL_TABLET | Freq: Every evening | ORAL | 2 refills | Status: DC | PRN
  Filled 2021-12-10: qty 30, 15d supply, fill #0

## 2021-12-10 NOTE — Telephone Encounter (Signed)
done

## 2021-12-10 NOTE — Telephone Encounter (Signed)
Done

## 2022-01-14 DIAGNOSIS — M532X2 Spinal instabilities, cervical region: Secondary | ICD-10-CM | POA: Diagnosis not present

## 2022-01-14 DIAGNOSIS — R519 Headache, unspecified: Secondary | ICD-10-CM | POA: Diagnosis not present

## 2022-01-14 DIAGNOSIS — H9311 Tinnitus, right ear: Secondary | ICD-10-CM | POA: Diagnosis not present

## 2022-01-15 ENCOUNTER — Other Ambulatory Visit: Payer: Self-pay | Admitting: Family Medicine

## 2022-01-21 DIAGNOSIS — R519 Headache, unspecified: Secondary | ICD-10-CM | POA: Diagnosis not present

## 2022-01-21 DIAGNOSIS — M532X2 Spinal instabilities, cervical region: Secondary | ICD-10-CM | POA: Diagnosis not present

## 2022-01-21 DIAGNOSIS — H9311 Tinnitus, right ear: Secondary | ICD-10-CM | POA: Diagnosis not present

## 2022-01-24 DIAGNOSIS — M532X2 Spinal instabilities, cervical region: Secondary | ICD-10-CM | POA: Diagnosis not present

## 2022-01-24 DIAGNOSIS — R519 Headache, unspecified: Secondary | ICD-10-CM | POA: Diagnosis not present

## 2022-01-24 DIAGNOSIS — H9311 Tinnitus, right ear: Secondary | ICD-10-CM | POA: Diagnosis not present

## 2022-01-31 ENCOUNTER — Other Ambulatory Visit: Payer: Self-pay | Admitting: Family Medicine

## 2022-03-17 ENCOUNTER — Encounter: Payer: Self-pay | Admitting: Family Medicine

## 2022-03-17 MED ORDER — SERTRALINE HCL 50 MG PO TABS: ORAL_TABLET | ORAL | 2 refills | Status: DC

## 2022-04-19 ENCOUNTER — Other Ambulatory Visit: Payer: Self-pay

## 2022-04-19 MED ORDER — PROPRANOLOL HCL 20 MG PO TABS: ORAL_TABLET | ORAL | 1 refills | Status: DC

## 2022-04-19 MED ORDER — LISINOPRIL 10 MG PO TABS: 10.0000 mg | ORAL_TABLET | Freq: Every day | ORAL | 1 refills | Status: DC

## 2022-09-11 ENCOUNTER — Encounter: Payer: Self-pay | Admitting: Family Medicine

## 2022-09-12 ENCOUNTER — Other Ambulatory Visit: Payer: Self-pay | Admitting: Family Medicine

## 2022-09-12 MED ORDER — TIZANIDINE HCL 2 MG PO TABS
ORAL_TABLET | ORAL | 2 refills | Status: DC
  Filled 2022-09-12 – 2022-09-13 (×2): qty 30, 15d supply, fill #0

## 2022-09-13 ENCOUNTER — Other Ambulatory Visit (HOSPITAL_BASED_OUTPATIENT_CLINIC_OR_DEPARTMENT_OTHER): Payer: Self-pay

## 2022-09-13 ENCOUNTER — Encounter: Payer: Self-pay | Admitting: Family Medicine

## 2022-09-13 MED ORDER — TIZANIDINE HCL 2 MG PO TABS: ORAL_TABLET | ORAL | 2 refills | Status: DC

## 2022-09-13 NOTE — Telephone Encounter (Signed)
Pt informed Rx sent downstairs.

## 2022-09-27 ENCOUNTER — Encounter: Payer: Self-pay | Admitting: Family Medicine

## 2022-09-28 ENCOUNTER — Encounter: Payer: Self-pay | Admitting: Family Medicine

## 2022-09-28 ENCOUNTER — Telehealth: Payer: BC Managed Care – PPO | Admitting: Family Medicine

## 2022-09-28 DIAGNOSIS — M545 Low back pain, unspecified: Secondary | ICD-10-CM | POA: Diagnosis not present

## 2022-09-28 MED ORDER — MELOXICAM 15 MG PO TABS: 15.0000 mg | ORAL_TABLET | Freq: Every day | ORAL | 0 refills | Status: DC

## 2022-09-28 MED ORDER — PREDNISONE 20 MG PO TABS: 40.0000 mg | ORAL_TABLET | Freq: Every day | ORAL | 0 refills | Status: AC

## 2022-09-28 NOTE — Progress Notes (Signed)
Virtual Video Visit via MyChart Note  I connected with  Joseph Mccullough on 09/28/22 at  2:00 PM EST by the video enabled telemedicine application for MyChart, and verified that I am speaking with the correct person using two identifiers.   I introduced myself as a Designer, jewellery with the practice. We discussed the limitations of evaluation and management by telemedicine and the availability of in person appointments. The patient expressed understanding and agreed to proceed.  Participating parties in this visit include: The patient and the nurse practitioner listed.  The patient is: At home I am: In the office - [facility]:    CC:  Chief Complaint  Patient presents with   pulled muscle in lower back    Right lower back pain and started Sunday. This happened at the gym.    HPI: Joseph Mccullough is a 31 y.o. year old male presenting today via [place] today for back pain.  BACK PAIN: Onset: Sunday, working out at the gym Location: right lower back  Duration: fluctuating Characteristics: stabbing pain Aggravating factors: getting up out of a chair, lifting a chair, bending Alleviating factors: ice, heating pad, ibuprofen, Zanaflex - no significant relief Radiating/associated symptoms: no radiation, no numbness tingling, urinary changes Timing: worse in the morning Severity: at rest 3/10, with activity 8/10     Past medical history, Surgical history, Family history not pertinant except as noted below, Social history, Allergies, and medications have been entered into the medical record, reviewed, and corrections made.   Review of Systems:  All review of systems negative except what is listed in the HPI   Objective:    General:  Speaking clearly in complete sentences. Absent shortness of breath noted.   Alert and oriented x3.   Normal judgment.  Absent acute distress.   Impression and Recommendations:    1. Acute  right-sided low back pain without sciatica - predniSONE (DELTASONE) 20 MG tablet; Take 2 tablets (40 mg total) by mouth daily with breakfast for 5 days.  Dispense: 10 tablet; Refill: 0 - meloxicam (MOBIC) 15 MG tablet; Take 1 tablet (15 mg total) by mouth daily.  Dispense: 30 tablet; Refill: 0  Prednisone for 5 days then switch to Meloxicam (do not combine these or take with any other NSAIDs like ibuprofen or Aleve) Okay to continue the as needed muscle relaxer, heat, ice, massage, stretches Home exercises attached Tylenol is okay to add if needed   Follow-up if symptoms worsen or fail to improve.    I discussed the assessment and treatment plan with the patient. The patient was provided an opportunity to ask questions and all were answered. The patient agreed with the plan and demonstrated an understanding of the instructions.   The patient was advised to call back or seek an in-person evaluation if the symptoms worsen or if the condition fails to improve as anticipated.  I spent 20 minutes dedicated to the care of this patient on the date of this encounter to include pre-visit chart review of prior notes and results, face-to-face time with the patient, and post-visit ordering of testing as indicated.   Terrilyn Saver, NP

## 2022-09-28 NOTE — Patient Instructions (Signed)
Prednisone for 5 days then switch to Meloxicam (do not combine these or take with any other NSAIDs like ibuprofen or Aleve) Okay to continue the as needed muscle relaxer, heat, ice, massage, stretches Home exercises attached Tylenol is okay to add if needed  BACK PAIN INFORMATION:  For many people, back pain returns. Since low back pain is rarely dangerous, it is often a condition that people can learn to manage on their own.  Remain active. It is stressful on the back to sit or stand in one place. Do not sit, drive, or stand in one place for more than 30 minutes at a time. Take short walks on level surfaces as soon as pain allows. Try to increase the length of time you walk each day.  Do not stay in bed. Resting more than 1 or 2 days can delay your recovery.  Do not avoid exercise or work. Your body is made to move. It is not dangerous to be active, even though your back may hurt. Your back will likely heal faster if you return to being active before your pain is gone.  Pay attention to your body when you  bend and lift. Many people have less discomfort when lifting if they bend their knees, keep the load close to their bodies, and avoid twisting. Often, the most comfortable positions are those that put less stress on your recovering back.  Find a comfortable position to sleep. Use a firm mattress and lie on your side with your knees slightly bent. If you lie on your back, put a pillow under your knees.  Only take over-the-counter or prescription medicines as directed by your caregiver. Over-the-counter medicines to reduce pain and inflammation are often the most helpful. Your caregiver may prescribe muscle relaxant drugs. These medicines help dull your pain so you can more quickly return to your normal activities and healthy exercise.  Put ice on the injured area.  Put ice in a plastic bag.  Place a towel between your skin and the bag.  Leave the ice on for 15 to 20 minutes, 3 to 4 times a day for  the first 2 to 3 days. After that, ice and heat may be alternated to reduce pain and spasms.  Ask your caregiver about trying back exercises and gentle massage. This may be of some benefit.  Avoid feeling anxious or stressed. Stress increases muscle tension and can worsen back pain. It is important to recognize when you are anxious or stressed and learn ways to manage it. Exercise is a great option.  SEEK IMMEDIATE MEDICAL CARE IF:  You have pain that radiates from your back into your legs.  You develop new bowel or bladder control problems.  You have unusual weakness or numbness in your arms or legs.  You develop nausea or vomiting.  You develop abdominal pain.  You feel faint.    BACK PAIN PLAN Giving steroid burst, NSAID, muscle relaxer Plan of rest, intermittent application of cold packs (later, may switch to heat, but do not sleep on heating pad) Home exercises discussed. Handout provided.  Proper lifting mechanics with avoidance of heavy lifting discussed.  Recommended Physical Therapy -  let us know if you would like a referral

## 2022-10-13 ENCOUNTER — Other Ambulatory Visit: Payer: Self-pay | Admitting: Family Medicine

## 2022-10-25 ENCOUNTER — Other Ambulatory Visit: Payer: Self-pay | Admitting: Family Medicine

## 2022-10-25 DIAGNOSIS — M545 Low back pain, unspecified: Secondary | ICD-10-CM

## 2022-10-25 DIAGNOSIS — J029 Acute pharyngitis, unspecified: Secondary | ICD-10-CM | POA: Diagnosis not present

## 2022-10-25 DIAGNOSIS — Z6826 Body mass index (BMI) 26.0-26.9, adult: Secondary | ICD-10-CM | POA: Diagnosis not present

## 2022-10-25 DIAGNOSIS — R051 Acute cough: Secondary | ICD-10-CM | POA: Diagnosis not present

## 2022-11-06 ENCOUNTER — Other Ambulatory Visit: Payer: Self-pay | Admitting: Family Medicine

## 2022-11-06 ENCOUNTER — Encounter: Payer: Self-pay | Admitting: Family Medicine

## 2022-11-07 MED ORDER — SERTRALINE HCL 50 MG PO TABS: 50.0000 mg | ORAL_TABLET | Freq: Every day | ORAL | 0 refills | Status: DC

## 2023-02-04 ENCOUNTER — Other Ambulatory Visit: Payer: Self-pay | Admitting: Family Medicine

## 2023-02-05 ENCOUNTER — Encounter: Payer: Self-pay | Admitting: Family Medicine

## 2023-02-09 NOTE — Progress Notes (Signed)
Complete physical exam  Patient: Joseph Mccullough   DOB: [birth year]   32 y.o. Male  MRN: [id number]  Subjective:    Chief Complaint  Patient presents with   Annual Exam    Joseph Mccullough is a 32 y.o. male who presents today for a complete physical exam. He reports consuming a  general, healthy  diet. Gym/ health club routine includes crossfit classes. He generally feels well. He reports sleeping well. He does have additional problems to discuss today.   HTN: currently taking lisinopril 10 mg daily and propranolol 20 mg daily, doing well, no new concerns Anxiety: currently taking propranolol 20 mg daily and Zoloft 50 mg daily. Doing well. No concerns. No SI/HI. Reports his ears feel clogged, muffled hearing. Started using Debrox drops a few days.    Most recent fall risk assessment:    04/19/2021    2:00 PM  Fall Risk   Falls in the past year? 0  Number falls in past yr: 0  Injury with Fall? 0     Most recent depression screenings:    09/28/2022    2:13 PM 04/19/2021    2:00 PM  PHQ 2/9 Scores  PHQ - 2 Score 0 0    Vision:Not within last year , Dental: No current dental problems and Receives regular dental care, and STD: no concerns  Patient Active Problem List   Diagnosis Date Noted   Preventative health care 11/24/2021   Educated about COVID-19 virus infection 12/29/2019   TMJ (dislocation of temporomandibular joint) 02/06/2019   ETD (eustachian tube dysfunction) 10/14/2018   Hiatal hernia with GERD 08/20/2018   Disorder of adenoid 08/20/2018   Localized swelling, mass, and lump of head 08/20/2018   Hypertension    Anxiety and depression 08/19/2018   Allergy    Past Medical History:  Diagnosis Date   Acid reflux 08/20/2018   Allergy    Anxiety and depression 08/19/2018   Hypertension    No Known Allergies    Patient Care Team: Bradd Canary, MD as PCP - General (Family Medicine) [facility], [facility])   Outpatient  Medications Prior to Visit  Medication Sig   lisinopril (ZESTRIL) 10 MG tablet Take 1 tablet (10 mg total) by mouth daily.   propranolol (INDERAL) 20 MG tablet Take 1 tablet (20 mg total) by mouth daily.   sertraline (ZOLOFT) 50 MG tablet Take 1 tablet (50 mg total) by mouth daily.   tiZANidine (ZANAFLEX) 2 MG tablet TAKE 1/2 TO 2 TABLETS(1 TO 4 MG) BY MOUTH AT BEDTIME AS NEEDED FOR MUSCLE SPASMS   [DISCONTINUED] meloxicam (MOBIC) 15 MG tablet TAKE 1 TABLET(15 MG) BY MOUTH DAILY   No facility-administered medications prior to visit.    ROS All review of systems negative except what is listed in the HPI        Objective:     BP 122/72   Pulse 62   Ht 6\' 3"  (1.905 m)   Wt 212 lb (96.2 kg)   SpO2 100%   BMI 26.50 kg/m    Physical Exam Vitals reviewed.  Constitutional:      General: He is not in acute distress.    Appearance: Normal appearance. He is not ill-appearing.  HENT:     Head: Normocephalic and atraumatic.     Right Ear: Tympanic membrane normal.     Left Ear: Tympanic membrane normal.     Ears:     Comments: Bilateral impaction, TMs  normal after ear irrigation     Nose: Nose normal.     Mouth/Throat:     Mouth: Mucous membranes are moist.     Pharynx: Oropharynx is clear.  Eyes:     Extraocular Movements: Extraocular movements intact.     Conjunctiva/sclera: Conjunctivae normal.     Pupils: Pupils are equal, round, and reactive to light.  Neck:     Vascular: No carotid bruit.  Cardiovascular:     Rate and Rhythm: Normal rate and regular rhythm.     Pulses: Normal pulses.     Heart sounds: Normal heart sounds.  Pulmonary:     Effort: Pulmonary effort is normal.     Breath sounds: Normal breath sounds.  Abdominal:     General: Abdomen is flat. Bowel sounds are normal. There is no distension.     Palpations: Abdomen is soft. There is no mass.     Tenderness: There is no abdominal tenderness. There is no right CVA tenderness, left CVA tenderness,  guarding or rebound.  Genitourinary:    Comments: Deferred exam Musculoskeletal:        General: Normal range of motion.     Cervical back: Normal range of motion and neck supple. No tenderness.     Right lower leg: No edema.     Left lower leg: No edema.  Lymphadenopathy:     Cervical: No cervical adenopathy.  Skin:    General: Skin is warm and dry.     Capillary Refill: Capillary refill takes less than 2 seconds.  Neurological:     General: No focal deficit present.     Mental Status: He is alert and oriented to person, place, and time. Mental status is at baseline.  Psychiatric:        Mood and Affect: Mood normal.        Behavior: Behavior normal.        Thought Content: Thought content normal.        Judgment: Judgment normal.      No results found for any visits on 02/10/23.     Assessment & Plan:    Routine Health Maintenance and Physical Exam  Immunization History  Administered Date(s) Administered   Influenza,inj,Quad PF,6+ Mos 08/21/2019   Janssen (J&J) SARS-COV-2 Vaccination 07/31/2020   Tdap 04/19/2021    Health Maintenance  Topic Date Due   HIV Screening  Never done   COVID-19 Vaccine (2 - 2023-24 season) 02/10/2024 (Originally 07/01/2022)   INFLUENZA VACCINE  06/01/2023   DTaP/Tdap/Td (2 - Td or Tdap) 04/20/2031   Hepatitis C Screening  Completed   HPV VACCINES  Aged Out    Discussed health benefits of physical activity, and encouraged him to engage in regular exercise appropriate for his age and condition.  Problem List Items Addressed This Visit     Hypertension - Primary    Blood pressure is at goal for age and co-morbidities.   Recommendations: continue lisinopril and propranolol  - BP goal <130/80 - monitor and log blood pressures at home - check around the same time each day in a relaxed setting - Limit salt to <2000 mg/day - Follow DASH eating plan (heart healthy diet) - limit alcohol to 2 standard drinks per day for men and 1 per day for  women - avoid tobacco products - get at least 2 hours of regular aerobic exercise weekly Patient aware of signs/symptoms requiring further/urgent evaluation. Labs updated today.       Relevant Orders  CBC with Differential/Platelet   Comprehensive metabolic panel   Lipid panel   TSH   Preventative health care   Relevant Orders   CBC with Differential/Platelet   Comprehensive metabolic panel   Lipid panel   TSH   Other Visit Diagnoses     Lipid screening       Relevant Orders   Lipid panel   Thyroid disorder screen       Relevant Orders   TSH   Bilateral impacted cerumen       Encounter for routine adult health examination with abnormal findings       Relevant Orders   CBC with Differential/Platelet   Comprehensive metabolic panel   Lipid panel   TSH      Indication: Cerumen impaction of the ear(s)  Medical necessity statement: On physical examination, cerumen impairs clinically significant portions of the external auditory canal, and tympanic membrane. Noted obstructive, copious cerumen that cannot be removed without magnification and instrumentations requiring professional removal.   Consent: Discussed benefits and risks of procedure and verbal consent obtained  Procedure: Patient was prepped for the procedure. Otoscope utilized to assess and take note of the ear canal, the tympanic membrane, and the presence, amount, and placement of the cerumen.  Gentle irrigation with water at body temperature and soft plastic curette utilized to remove impacted cerumen.  Excess water drained by gravity and ear canal(s) dried with clean guaze.  Post procedure examination: Otoscopic examination reveals complete cerumen removal with no damage to the auditory canal, tympanic membrane, or surrounding tissue.  Patient tolerated procedure well.   Post procedure instructions: Patient made aware that they may experience temporary vertigo, temporary changes in hearing, and temporary  discomfort. If these symptom last for more than 24 hours to call the clinic or proceed to the ED for further evaluation. Discussed avoiding placing objects into the ear canal for cleaning.    Return in about 1 year (around 02/10/2024) for physical.     [name], NP

## 2023-02-10 ENCOUNTER — Encounter: Payer: Self-pay | Admitting: Family Medicine

## 2023-02-10 ENCOUNTER — Ambulatory Visit (INDEPENDENT_AMBULATORY_CARE_PROVIDER_SITE_OTHER): Payer: BC Managed Care – PPO | Admitting: Family Medicine

## 2023-02-10 VITALS — BP 122/72 | HR 62 | Ht 75.0 in | Wt 212.0 lb

## 2023-02-10 DIAGNOSIS — Z13220 Encounter for screening for lipoid disorders: Secondary | ICD-10-CM

## 2023-02-10 DIAGNOSIS — Z0000 Encounter for general adult medical examination without abnormal findings: Secondary | ICD-10-CM

## 2023-02-10 DIAGNOSIS — H6123 Impacted cerumen, bilateral: Secondary | ICD-10-CM | POA: Diagnosis not present

## 2023-02-10 DIAGNOSIS — I10 Essential (primary) hypertension: Secondary | ICD-10-CM | POA: Diagnosis not present

## 2023-02-10 DIAGNOSIS — Z1329 Encounter for screening for other suspected endocrine disorder: Secondary | ICD-10-CM | POA: Diagnosis not present

## 2023-02-10 DIAGNOSIS — Z0001 Encounter for general adult medical examination with abnormal findings: Secondary | ICD-10-CM

## 2023-02-10 LAB — COMPREHENSIVE METABOLIC PANEL
ALT: 36 U/L (ref 0–53)
AST: 31 U/L (ref 0–37)
Albumin: 4.6 g/dL (ref 3.5–5.2)
Alkaline Phosphatase: 78 U/L (ref 39–117)
BUN: 21 mg/dL (ref 6–23)
CO2: 28 mEq/L (ref 19–32)
Calcium: 9.8 mg/dL (ref 8.4–10.5)
Chloride: 101 mEq/L (ref 96–112)
Creatinine, Ser: 1.14 mg/dL (ref 0.40–1.50)
GFR: 84.2 mL/min (ref >60.00)
Glucose, Bld: 97 mg/dL (ref 70–99)
Potassium: 4.2 mEq/L (ref 3.5–5.1)
Sodium: 137 mEq/L (ref 135–145)
Total Bilirubin: 0.6 mg/dL (ref 0.2–1.2)
Total Protein: 7 g/dL (ref 6.0–8.3)

## 2023-02-10 LAB — LIPID PANEL
Cholesterol: 163 mg/dL (ref 0–200)
HDL: 49.8 mg/dL (ref >39.00)
LDL Cholesterol: 90 mg/dL (ref 0–99)
NonHDL: 113.49
Total CHOL/HDL Ratio: 3
Triglycerides: 119 mg/dL (ref 0.0–149.0)
VLDL: 23.8 mg/dL (ref 0.0–40.0)

## 2023-02-10 LAB — TSH: TSH: 1.94 u[IU]/mL (ref 0.35–5.50)

## 2023-02-10 LAB — CBC WITH DIFFERENTIAL/PLATELET
Basophils Absolute: 0 10*3/uL (ref 0.0–0.1)
Basophils Relative: 0.7 % (ref 0.0–3.0)
Eosinophils Absolute: 0.2 10*3/uL (ref 0.0–0.7)
Eosinophils Relative: 3.4 % (ref 0.0–5.0)
HCT: 46.1 % (ref 39.0–52.0)
Hemoglobin: 15.7 g/dL (ref 13.0–17.0)
Lymphocytes Relative: 34.2 % (ref 12.0–46.0)
Lymphs Abs: 2.1 10*3/uL (ref 0.7–4.0)
MCHC: 34 g/dL (ref 30.0–36.0)
MCV: 88.5 fl (ref 78.0–100.0)
Monocytes Absolute: 0.6 10*3/uL (ref 0.1–1.0)
Monocytes Relative: 9.8 % (ref 3.0–12.0)
Neutro Abs: 3.2 10*3/uL (ref 1.4–7.7)
Neutrophils Relative %: 51.9 % (ref 43.0–77.0)
Platelets: 305 10*3/uL (ref 150.0–400.0)
RBC: 5.21 Mil/uL (ref 4.22–5.81)
RDW: 13.7 % (ref 11.5–15.5)
WBC: 6.1 10*3/uL (ref 4.0–10.5)

## 2023-02-10 NOTE — Assessment & Plan Note (Signed)
Blood pressure is at goal for age and co-morbidities.   Recommendations: continue lisinopril and propranolol  - BP goal <130/80 - monitor and log blood pressures at home - check around the same time each day in a relaxed setting - Limit salt to <2000 mg/day - Follow DASH eating plan (heart healthy diet) - limit alcohol to 2 standard drinks per day for men and 1 per day for women - avoid tobacco products - get at least 2 hours of regular aerobic exercise weekly Patient aware of signs/symptoms requiring further/urgent evaluation. Labs updated today.

## 2023-02-14 DIAGNOSIS — L700 Acne vulgaris: Secondary | ICD-10-CM | POA: Diagnosis not present

## 2023-02-14 DIAGNOSIS — D485 Neoplasm of uncertain behavior of skin: Secondary | ICD-10-CM | POA: Diagnosis not present

## 2023-02-14 DIAGNOSIS — L720 Epidermal cyst: Secondary | ICD-10-CM | POA: Diagnosis not present

## 2023-02-14 DIAGNOSIS — Z808 Family history of malignant neoplasm of other organs or systems: Secondary | ICD-10-CM | POA: Diagnosis not present

## 2023-02-28 DIAGNOSIS — L720 Epidermal cyst: Secondary | ICD-10-CM | POA: Diagnosis not present

## 2023-02-28 DIAGNOSIS — D485 Neoplasm of uncertain behavior of skin: Secondary | ICD-10-CM | POA: Diagnosis not present

## 2023-04-14 ENCOUNTER — Other Ambulatory Visit: Payer: Self-pay | Admitting: Family Medicine

## 2023-04-17 ENCOUNTER — Other Ambulatory Visit: Payer: Self-pay | Admitting: Family Medicine

## 2023-05-06 ENCOUNTER — Other Ambulatory Visit: Payer: Self-pay | Admitting: Family Medicine

## 2023-05-08 ENCOUNTER — Encounter: Payer: Self-pay | Admitting: *Deleted

## 2023-10-02 ENCOUNTER — Encounter: Payer: Self-pay | Admitting: Family Medicine

## 2023-10-03 ENCOUNTER — Encounter: Payer: Self-pay | Admitting: Family Medicine

## 2023-10-03 ENCOUNTER — Telehealth: Payer: BC Managed Care – PPO | Admitting: Family Medicine

## 2023-10-03 DIAGNOSIS — J208 Acute bronchitis due to other specified organisms: Secondary | ICD-10-CM | POA: Diagnosis not present

## 2023-10-03 MED ORDER — PREDNISONE 20 MG PO TABS: 40.0000 mg | ORAL_TABLET | Freq: Every day | ORAL | 0 refills | Status: AC

## 2023-10-03 MED ORDER — HYDROCOD POLI-CHLORPHE POLI ER 10-8 MG/5ML PO SUER: 5.0000 mL | Freq: Two times a day (BID) | ORAL | 0 refills | Status: AC | PRN

## 2023-10-03 NOTE — Progress Notes (Signed)
Virtual Video Visit via MyChart Note  I connected with  [name] on 10/03/23 at  8:00 AM EST by the video enabled telemedicine application for MyChart, and verified that I am speaking with the correct person using two identifiers.   I introduced myself as a Publishing rights manager with the practice. We discussed the limitations of evaluation and management by telemedicine and the availability of in person appointments. The patient expressed understanding and agreed to proceed.  Participating parties in this visit include: The patient and the nurse practitioner listed.  The patient is: At home I am: In the office - [facility]  Subjective:    CC:  Chief Complaint  Patient presents with   Cough    HPI: Joseph Mccullough is a 32 y.o. year old male presenting today via MyChart today for cough.   Discussed the use of AI scribe software for clinical note transcription with the patient, who gave verbal consent to proceed.  History of Present Illness   The patient presents with a persistent dry cough that has been ongoing for over a week. The cough began following a cold approximately two weeks prior. The cough is described as a dry, tickling sensation in the throat and upper chest, which intensifies when lying down or even when propped up. This has significantly disrupted the patient's sleep, with an example of only achieving three hours of sleep the previous night due to the cough. The patient has been self-managing with Mucinex, which has not provided significant relief. The cough is mostly dry, with occasional expectoration in the mornings. The patient denies any associated fever, breathing difficulties, or wheezing.  In the past, the patient has found relief from similar symptoms with prednisone. They have also tried benzonatate for cough suppression, but it did not provide significant relief. The patient's blood pressure and heart rate are well controlled on  Lisinopril and Propranolol.            Past medical history, Surgical history, Family history not pertinant except as noted below, Social history, Allergies, and medications have been entered into the medical record, reviewed, and corrections made.   Review of Systems:  All review of systems negative except what is listed in the HPI   Objective:    General:  Speaking clearly in complete sentences. Absent shortness of breath noted.   Alert and oriented x3.   Normal judgment.  Absent acute distress.   Impression and Recommendations:    Problem List Items Addressed This Visit   None Visit Diagnoses     Viral bronchitis    -  Primary   Relevant Medications   predniSONE (DELTASONE) 20 MG tablet   chlorpheniramine-HYDROcodone (TUSSIONEX) 10-8 MG/5ML          Viral Bronchitis Persistent dry cough for over a week following a cold. No fever, dyspnea, or wheezing. Previous good response to prednisone. -Start Prednisone burst for 5 days. -Continue Mucinex. -Add Delsym for additional cough suppression. -Prescribe stronger cough syrup for nighttime use, with caution for potential drowsiness. -Follow-up if symptoms persist or worsen.          Follow-up if symptoms worsen or fail to improve.    I discussed the assessment and treatment plan with the patient. The patient was provided an opportunity to ask questions and all were answered. The patient agreed with the plan and demonstrated an understanding of the instructions.   The patient was advised to call back or seek an  in-person evaluation if the symptoms worsen or if the condition fails to improve as anticipated.   Joseph Dana, NP

## 2023-10-03 NOTE — Progress Notes (Signed)
From mychart message: Joseph Mccullough  to [facility] (supporting [name], NP)    10/02/23 11:21 AM I've had a dry cough with a tickle in upper chest/throat for about a week now. I have been taking Mucinex. I know in the past Prednisone has helped me move beyond a cough. Would I be able to try a round to hopefully knock this cough out?

## 2023-10-09 ENCOUNTER — Other Ambulatory Visit: Payer: Self-pay | Admitting: Family Medicine

## 2023-10-10 ENCOUNTER — Other Ambulatory Visit: Payer: Self-pay | Admitting: Family Medicine

## 2023-11-11 ENCOUNTER — Other Ambulatory Visit: Payer: Self-pay | Admitting: Family Medicine

## 2024-04-08 ENCOUNTER — Encounter: Payer: Self-pay | Admitting: Family Medicine

## 2024-04-08 ENCOUNTER — Other Ambulatory Visit: Payer: Self-pay | Admitting: Family Medicine

## 2024-04-08 ENCOUNTER — Other Ambulatory Visit (HOSPITAL_BASED_OUTPATIENT_CLINIC_OR_DEPARTMENT_OTHER): Payer: Self-pay

## 2024-04-08 MED ORDER — PROPRANOLOL HCL 20 MG PO TABS
20.0000 mg | ORAL_TABLET | Freq: Every day | ORAL | 0 refills | Status: DC
  Filled 2024-04-08: qty 30, 30d supply, fill #0

## 2024-04-08 MED ORDER — LISINOPRIL 10 MG PO TABS
10.0000 mg | ORAL_TABLET | Freq: Every day | ORAL | 0 refills | Status: DC
  Filled 2024-04-08: qty 30, 30d supply, fill #0

## 2024-04-08 NOTE — Telephone Encounter (Signed)
 Rxs refilled until appt and pt called and moved over to [place] schedule.

## 2024-04-09 ENCOUNTER — Other Ambulatory Visit: Payer: Self-pay

## 2024-04-11 ENCOUNTER — Other Ambulatory Visit: Payer: Self-pay | Admitting: Family

## 2024-04-11 ENCOUNTER — Other Ambulatory Visit (HOSPITAL_BASED_OUTPATIENT_CLINIC_OR_DEPARTMENT_OTHER): Payer: Self-pay

## 2024-04-11 MED ORDER — PROPRANOLOL HCL 20 MG PO TABS
20.0000 mg | ORAL_TABLET | Freq: Every day | ORAL | 0 refills | Status: DC
  Filled 2024-04-11: qty 90, 90d supply, fill #0

## 2024-04-11 MED ORDER — PROPRANOLOL HCL 20 MG PO TABS: 20.0000 mg | ORAL_TABLET | Freq: Every day | ORAL | 0 refills | Status: DC

## 2024-04-11 MED ORDER — LISINOPRIL 10 MG PO TABS
10.0000 mg | ORAL_TABLET | Freq: Every day | ORAL | 0 refills | Status: DC
  Filled 2024-04-11: qty 90, 90d supply, fill #0

## 2024-04-11 MED ORDER — LISINOPRIL 10 MG PO TABS: 10.0000 mg | ORAL_TABLET | Freq: Every day | ORAL | 0 refills | Status: DC

## 2024-04-11 NOTE — Addendum Note (Signed)
 Addended by: Demani Mcbrien D on: 04/11/2024 11:57 AM   Modules accepted: Orders

## 2024-05-15 NOTE — Assessment & Plan Note (Signed)
 Well controlled, no changes to meds. Encouraged heart healthy diet such as the DASH diet and exercise as tolerated.

## 2024-05-15 NOTE — Assessment & Plan Note (Signed)
 Stable on current medication.

## 2024-05-15 NOTE — Progress Notes (Unsigned)
 Subjective:     Patient ID: Joseph Mccullough, male    DOB: 12/02/1990, 33 y.o.   MRN: [id number]  No chief complaint on file.   HPI  Patient is in today for annual preventative exam and follow up on chronic medical concerns. No recent febrile illness or hospitalizations. Patient denies fever, chills, SOB, CP, palpitations, dyspnea, edema, HA, vision changes, N/V/D, abdominal pain, urinary symptoms, rash, weight changes, and recent illness or hospitalizations.   HTN Lisinopril  10 mg daily, Propranolol  20 mg daily  Anxiety/depression Zoloft  50 mg daily  HCM:  Immunizations:   History of Present Illness              Health Maintenance Due  Topic Date Due   HIV Screening  Never done   Hepatitis B Vaccines (1 of 3 - 19+ 3-dose series) Never done   HPV VACCINES (1 - 3-dose SCDM series) Never done   COVID-19 Vaccine (3 - 2024-25 season) 07/02/2023    Past Medical History:  Diagnosis Date   Acid reflux 08/20/2018   Allergy    Anxiety and depression 08/19/2018   Hypertension     No past surgical history on file.  Family History  Problem Relation Age of Onset   Hypertension Father     Social History   Socioeconomic History   Marital status: Single    Spouse name: Not on file   Number of children: Not on file   Years of education: Not on file   Highest education level: Not on file  Occupational History   Not on file  Tobacco Use   Smoking status: Never   Smokeless tobacco: Never  Substance and Sexual Activity   Alcohol use: No   Drug use: No   Sexual activity: Not on file  Other Topics Concern   Not on file  Social History Narrative   Not on file   Social Drivers of Health   Financial Resource Strain: Not on file  Food Insecurity: Not on file  Transportation Needs: Not on file  Physical Activity: Not on file  Stress: Not on file  Social Connections: Not on file  Intimate Partner Violence: Not on file    Outpatient Medications Prior to Visit   Medication Sig Dispense Refill   lisinopril  (ZESTRIL ) 10 MG tablet Take 1 tablet (10 mg total) by mouth daily. 90 tablet 0   propranolol  (INDERAL ) 20 MG tablet Take 1 tablet (20 mg total) by mouth daily. 90 tablet 0   sertraline  (ZOLOFT ) 50 MG tablet TAKE 1 TABLET(50 MG) BY MOUTH DAILY 90 tablet 1   tiZANidine  (ZANAFLEX ) 2 MG tablet TAKE 1/2 TO 2 TABLETS(1 TO 4 MG) BY MOUTH AT BEDTIME AS NEEDED FOR MUSCLE SPASMS 30 tablet 2   No facility-administered medications prior to visit.    No Known Allergies  ROS See HPI    Objective:    Physical Exam Vitals reviewed.  Constitutional:      General: He is not in acute distress.    Appearance: Normal appearance. He is not ill-appearing.  HENT:     Head: Normocephalic and atraumatic.     Right Ear: Tympanic membrane normal.     Left Ear: Tympanic membrane normal.     Nose: Nose normal.     Mouth/Throat:     Mouth: Mucous membranes are moist.     Pharynx: Oropharynx is clear.  Eyes:     Extraocular Movements: Extraocular movements intact.     Conjunctiva/sclera: Conjunctivae normal.  Pupils: Pupils are equal, round, and reactive to light.  Neck:     Thyroid : No thyroid  mass or thyromegaly.     Vascular: No carotid bruit.  Cardiovascular:     Rate and Rhythm: Normal rate and regular rhythm.     Pulses: Normal pulses.     Heart sounds: Normal heart sounds.  Pulmonary:     Effort: Pulmonary effort is normal.     Breath sounds: Normal breath sounds.  Abdominal:     General: Abdomen is flat. Bowel sounds are normal. There is no distension.     Palpations: Abdomen is soft. There is no mass.     Tenderness: There is no abdominal tenderness. There is no right CVA tenderness, left CVA tenderness, guarding or rebound.  Genitourinary:    Comments: Deferred exam Musculoskeletal:        General: Normal range of motion.     Cervical back: Normal range of motion and neck supple. No tenderness.     Right lower leg: No edema.     Left  lower leg: No edema.  Lymphadenopathy:     Cervical: No cervical adenopathy.  Skin:    General: Skin is warm and dry.     Capillary Refill: Capillary refill takes less than 2 seconds.  Neurological:     General: No focal deficit present.     Mental Status: He is alert and oriented to person, place, and time. Mental status is at baseline.  Psychiatric:        Mood and Affect: Mood normal.        Behavior: Behavior normal.        Thought Content: Thought content normal.        Judgment: Judgment normal.      There were no vitals taken for this visit. Wt Readings from Last 3 Encounters:  02/10/23 212 lb (96.2 kg)  11/23/21 208 lb 9.6 oz (94.6 kg)  04/19/21 213 lb 3.2 oz (96.7 kg)       Assessment & Plan:   Problem List Items Addressed This Visit     Anxiety and depression   Stable on current medication.      Hypertension   Well controlled, no changes to meds. Encouraged heart healthy diet such as the DASH diet and exercise as tolerated.        Preventative health care   Patient encouraged to maintain heart healthy diet, regular exercise, adequate sleep. Consider daily probiotics. Take medications as prescribed. Labs ordered and reviewed. Declines COVID boosters.       Other Visit Diagnoses       Annual visit for general adult medical examination without abnormal findings    -  Primary       I am having Joseph Mccullough maintain his tiZANidine , sertraline , lisinopril , and propranolol .  No orders of the defined types were placed in this encounter.

## 2024-05-15 NOTE — Assessment & Plan Note (Signed)
 Patient encouraged to maintain heart healthy diet, regular exercise, adequate sleep. Consider daily probiotics. Take medications as prescribed. Labs ordered and reviewed. Declines COVID boosters.

## 2024-05-17 ENCOUNTER — Encounter: Payer: Self-pay | Admitting: Student

## 2024-05-17 ENCOUNTER — Ambulatory Visit (INDEPENDENT_AMBULATORY_CARE_PROVIDER_SITE_OTHER): Admitting: Student

## 2024-05-17 VITALS — BP 118/80 | HR 64 | Temp 97.6°F | Ht 75.0 in | Wt 208.8 lb

## 2024-05-17 DIAGNOSIS — Z0000 Encounter for general adult medical examination without abnormal findings: Secondary | ICD-10-CM | POA: Diagnosis not present

## 2024-05-17 DIAGNOSIS — I10 Essential (primary) hypertension: Secondary | ICD-10-CM

## 2024-05-17 DIAGNOSIS — H6123 Impacted cerumen, bilateral: Secondary | ICD-10-CM | POA: Diagnosis not present

## 2024-05-17 DIAGNOSIS — F419 Anxiety disorder, unspecified: Secondary | ICD-10-CM

## 2024-05-17 DIAGNOSIS — F32A Depression, unspecified: Secondary | ICD-10-CM

## 2024-05-17 LAB — CBC WITH DIFFERENTIAL/PLATELET
Basophils Absolute: 0 K/uL (ref 0.0–0.1)
Basophils Relative: 0.7 % (ref 0.0–3.0)
Eosinophils Absolute: 0.2 K/uL (ref 0.0–0.7)
Eosinophils Relative: 3.6 % (ref 0.0–5.0)
HCT: 46.9 % (ref 39.0–52.0)
Hemoglobin: 15.6 g/dL (ref 13.0–17.0)
Lymphocytes Relative: 33.8 % (ref 12.0–46.0)
Lymphs Abs: 2 K/uL (ref 0.7–4.0)
MCHC: 33.3 g/dL (ref 30.0–36.0)
MCV: 88.7 fl (ref 78.0–100.0)
Monocytes Absolute: 0.6 K/uL (ref 0.1–1.0)
Monocytes Relative: 10 % (ref 3.0–12.0)
Neutro Abs: 3.1 K/uL (ref 1.4–7.7)
Neutrophils Relative %: 51.9 % (ref 43.0–77.0)
Platelets: 295 K/uL (ref 150.0–400.0)
RBC: 5.29 Mil/uL (ref 4.22–5.81)
RDW: 13.7 % (ref 11.5–15.5)
WBC: 5.9 K/uL (ref 4.0–10.5)

## 2024-05-17 LAB — COMPREHENSIVE METABOLIC PANEL WITH GFR
ALT: 27 U/L (ref 0–53)
AST: 23 U/L (ref 0–37)
Albumin: 4.7 g/dL (ref 3.5–5.2)
Alkaline Phosphatase: 65 U/L (ref 39–117)
BUN: 18 mg/dL (ref 6–23)
CO2: 31 meq/L (ref 19–32)
Calcium: 9.7 mg/dL (ref 8.4–10.5)
Chloride: 101 meq/L (ref 96–112)
Creatinine, Ser: 1.24 mg/dL (ref 0.40–1.50)
GFR: 75.44 mL/min (ref >60.00)
Glucose, Bld: 99 mg/dL (ref 70–99)
Potassium: 5 meq/L (ref 3.5–5.1)
Sodium: 137 meq/L (ref 135–145)
Total Bilirubin: 0.9 mg/dL (ref 0.2–1.2)
Total Protein: 7.1 g/dL (ref 6.0–8.3)

## 2024-05-17 LAB — TSH: TSH: 1.67 u[IU]/mL (ref 0.35–5.50)

## 2024-05-17 MED ORDER — LISINOPRIL 10 MG PO TABS: 10.0000 mg | ORAL_TABLET | Freq: Every day | ORAL | 3 refills | Status: DC

## 2024-05-17 MED ORDER — PROPRANOLOL HCL 20 MG PO TABS: 20.0000 mg | ORAL_TABLET | Freq: Every day | ORAL | 3 refills | Status: DC

## 2024-05-17 NOTE — Assessment & Plan Note (Signed)
Ear irrigation performed.

## 2024-05-18 LAB — LIPID PANEL
Cholesterol: 147 mg/dL (ref <200)
HDL: 52 mg/dL (ref >=40)
LDL Cholesterol (Calc): 81 mg/dL
Non-HDL Cholesterol (Calc): 95 mg/dL (ref <130)
Total CHOL/HDL Ratio: 2.8 (calc) (ref <5.0)
Triglycerides: 68 mg/dL (ref <150)

## 2024-05-18 LAB — HIV ANTIBODY (ROUTINE TESTING W REFLEX): HIV 1&2 Ab, 4th Generation: NONREACTIVE

## 2024-05-20 ENCOUNTER — Ambulatory Visit: Payer: Self-pay | Admitting: Student

## 2024-05-24 ENCOUNTER — Encounter: Admitting: Student

## 2024-05-24 ENCOUNTER — Encounter: Payer: Self-pay | Admitting: Family Medicine

## 2024-05-24 ENCOUNTER — Encounter: Admitting: Family Medicine

## 2024-05-24 MED ORDER — TIZANIDINE HCL 2 MG PO TABS: ORAL_TABLET | ORAL | 2 refills | Status: DC

## 2024-11-27 NOTE — Progress Notes (Signed)
 "   MyChart Video Visit    Virtual Visit via Video Note   This patient is at least at moderate risk for complications without adequate follow up. This format is felt to be most appropriate for this patient at this time. Physical exam was limited by quality of the video and audio technology used for the visit. Porsha, CMA was able to get the patient set up on a video visit.  Patient location: home Patient and provider in visit Provider location: Office  I discussed the limitations of evaluation and management by telemedicine and the availability of in person appointments. The patient expressed understanding and agreed to proceed.  Visit Date: 11/28/2024  Today's healthcare provider: Harlene Horton, MD     Subjective:    Patient ID: Joseph Mccullough, male    DOB: 1991-09-01, 34 y.o.   MRN: [id number]  No chief complaint on file.   HPI Discussed the use of AI scribe software for clinical note transcription with the patient, who gave verbal consent to proceed.  History of Present Illness     Past Medical History:  Diagnosis Date   Acid reflux 08/20/2018   Allergy    Anxiety and depression 08/19/2018   Hypertension     No past surgical history on file.  Family History  Problem Relation Age of Onset   Hypertension Father     Social History   Socioeconomic History   Marital status: Single    Spouse name: Not on file   Number of children: 0   Years of education: Not on file   Highest education level: Not on file  Occupational History   Not on file  Tobacco Use   Smoking status: Never   Smokeless tobacco: Never  Substance and Sexual Activity   Alcohol use: Yes    Comment: rarely less than once per month   Drug use: No   Sexual activity: Not on file  Other Topics Concern   Not on file  Social History Narrative   Not on file   Social Drivers of Health   Tobacco Use: Low Risk (05/17/2024)   Patient History    Smoking Tobacco Use: Never     Smokeless Tobacco Use: Never    Passive Exposure: Not on file  Financial Resource Strain: Not on file  Food Insecurity: Not on file  Transportation Needs: Not on file  Physical Activity: Not on file  Stress: Not on file  Social Connections: Not on file  Intimate Partner Violence: Not on file  Depression (PHQ2-9): Low Risk (05/17/2024)   Depression (PHQ2-9)    PHQ-2 Score: 0  Alcohol Screen: Not on file  Housing: Not on file  Utilities: Not on file  Health Literacy: Not on file    Outpatient Medications Prior to Visit  Medication Sig Dispense Refill   lisinopril  (ZESTRIL ) 10 MG tablet Take 1 tablet (10 mg total) by mouth daily. 90 tablet 3   propranolol  (INDERAL ) 20 MG tablet Take 1 tablet (20 mg total) by mouth daily. 90 tablet 3   tiZANidine  (ZANAFLEX ) 2 MG tablet TAKE 1/2 TO 2 TABLETS(1 TO 4 MG) BY MOUTH AT BEDTIME AS NEEDED FOR MUSCLE SPASMS 30 tablet 2   No facility-administered medications prior to visit.    Allergies[1]  Review of Systems  Constitutional:  Negative for fever and malaise/fatigue.  HENT:  Negative for congestion.   Eyes:  Negative for blurred vision.  Respiratory:  Negative for shortness of breath.   Cardiovascular:  Negative for  chest pain, palpitations and leg swelling.  Gastrointestinal:  Negative for abdominal pain, blood in stool and nausea.  Genitourinary:  Negative for dysuria and frequency.  Musculoskeletal:  Negative for falls.  Skin:  Negative for rash.  Neurological:  Negative for dizziness, loss of consciousness and headaches.  Endo/Heme/Allergies:  Negative for environmental allergies.  Psychiatric/Behavioral:  Negative for depression. The patient is not nervous/anxious.        Objective:    Physical Exam Vitals reviewed.  Constitutional:      Appearance: Normal appearance. He is not ill-appearing.  HENT:     Head: Normocephalic and atraumatic.     Nose: Nose normal.  Eyes:     Conjunctiva/sclera: Conjunctivae normal.   Cardiovascular:     Rate and Rhythm: Normal rate.     Pulses: Normal pulses.     Heart sounds: Normal heart sounds. No murmur heard. Pulmonary:     Effort: Pulmonary effort is normal.     Breath sounds: Normal breath sounds. No wheezing.  Abdominal:     Palpations: Abdomen is soft. There is no mass.     Tenderness: There is no abdominal tenderness.  Musculoskeletal:     Cervical back: Normal range of motion.     Right lower leg: No edema.     Left lower leg: No edema.  Skin:    General: Skin is warm and dry.  Neurological:     General: No focal deficit present.     Mental Status: He is alert and oriented to person, place, and time.  Psychiatric:        Mood and Affect: Mood normal.    There were no vitals taken for this visit. Wt Readings from Last 3 Encounters:  05/17/24 208 lb 12.8 oz (94.7 kg)  02/10/23 212 lb (96.2 kg)  11/23/21 208 lb 9.6 oz (94.6 kg)       Assessment & Plan:  Hypertension, unspecified type Assessment & Plan: Well controlled, no changes to meds. Encouraged heart healthy diet such as the DASH diet and exercise as tolerated.    Anxiety and depression Assessment & Plan: Doing well at present      Assessment and Plan Assessment & Plan      I discussed the assessment and treatment plan with the patient. The patient was provided an opportunity to ask questions and all were answered. The patient agreed with the plan and demonstrated an understanding of the instructions.   The patient was advised to call back or seek an in-person evaluation if the symptoms worsen or if the condition fails to improve as anticipated.  Harlene Horton, MD [facility] [phone] (phone) [phone] (fax)  [facility]      [1] No Known Allergies "

## 2024-11-27 NOTE — Assessment & Plan Note (Addendum)
 Doing well at present

## 2024-11-27 NOTE — Assessment & Plan Note (Signed)
 Well controlled, no changes to meds. Encouraged heart healthy diet such as the DASH diet and exercise as tolerated.

## 2024-11-28 ENCOUNTER — Telehealth: Admitting: Family Medicine

## 2024-11-28 DIAGNOSIS — F32A Depression, unspecified: Secondary | ICD-10-CM | POA: Diagnosis not present

## 2024-11-28 DIAGNOSIS — S0300XD Dislocation of jaw, unspecified side, subsequent encounter: Secondary | ICD-10-CM

## 2024-11-28 DIAGNOSIS — I10 Essential (primary) hypertension: Secondary | ICD-10-CM | POA: Diagnosis not present

## 2024-11-28 DIAGNOSIS — S0300XA Dislocation of jaw, unspecified side, initial encounter: Secondary | ICD-10-CM

## 2024-11-28 DIAGNOSIS — F419 Anxiety disorder, unspecified: Secondary | ICD-10-CM

## 2024-11-28 MED ORDER — LISINOPRIL 10 MG PO TABS: 10.0000 mg | ORAL_TABLET | Freq: Every day | ORAL | 3 refills | Status: AC

## 2024-11-28 MED ORDER — PROPRANOLOL HCL 20 MG PO TABS: 20.0000 mg | ORAL_TABLET | Freq: Every day | ORAL | 3 refills | Status: AC

## 2024-11-28 MED ORDER — SERTRALINE HCL 50 MG PO TABS: 50.0000 mg | ORAL_TABLET | Freq: Every day | ORAL | 3 refills | Status: AC

## 2024-11-28 MED ORDER — TIZANIDINE HCL 2 MG PO TABS: ORAL_TABLET | ORAL | 5 refills | Status: AC

## 2024-11-28 NOTE — Assessment & Plan Note (Signed)
 Still struggles with intermittent clinching and jaw pain. Infrequently when pain escalates he will use Tizanidine  with good relief so refill given and encouraged to try various topical rubs to manage pain as well
